# Patient Record
Sex: Male | Born: 1975
Health system: Southern US, Community
[De-identification: ages and names within clinical notes are randomized; demographics above are authoritative.]

## PROBLEM LIST (undated history)

## (undated) DIAGNOSIS — I10 Essential (primary) hypertension: Secondary | ICD-10-CM

## (undated) DIAGNOSIS — G4733 Obstructive sleep apnea (adult) (pediatric): Secondary | ICD-10-CM

## (undated) DIAGNOSIS — E119 Type 2 diabetes mellitus without complications: Principal | ICD-10-CM

## (undated) DIAGNOSIS — J4520 Mild intermittent asthma, uncomplicated: Secondary | ICD-10-CM

## (undated) DIAGNOSIS — K852 Alcohol induced acute pancreatitis without necrosis or infection: Secondary | ICD-10-CM

## (undated) DIAGNOSIS — E1110 Type 2 diabetes mellitus with ketoacidosis without coma: Secondary | ICD-10-CM

## (undated) DIAGNOSIS — E785 Hyperlipidemia, unspecified: Secondary | ICD-10-CM

## (undated) HISTORY — DX: Essential (primary) hypertension: I10

## (undated) HISTORY — PX: NO PAST SURGERIES: SHX2092

## (undated) HISTORY — DX: Obstructive sleep apnea (adult) (pediatric): G47.33

## (undated) HISTORY — DX: Type 2 diabetes mellitus without complications: E11.9

## (undated) HISTORY — DX: Hyperlipidemia, unspecified: E78.5

## (undated) HISTORY — DX: Mild intermittent asthma, uncomplicated: J45.20

## (undated) HISTORY — DX: Alcohol induced acute pancreatitis without necrosis or infection: K85.20

---

## 1999-08-18 DIAGNOSIS — K852 Alcohol induced acute pancreatitis without necrosis or infection: Secondary | ICD-10-CM

## 1999-08-18 HISTORY — DX: Alcohol induced acute pancreatitis without necrosis or infection: K85.20

## 2005-06-13 ENCOUNTER — Emergency Department (HOSPITAL_COMMUNITY): Admission: EM | Admit: 2005-06-13 | Discharge: 2005-06-14 | Payer: Self-pay | Admitting: Emergency Medicine

## 2005-06-14 ENCOUNTER — Emergency Department (HOSPITAL_COMMUNITY): Admission: EM | Admit: 2005-06-14 | Discharge: 2005-06-14 | Payer: Self-pay | Admitting: Emergency Medicine

## 2005-07-17 ENCOUNTER — Emergency Department (HOSPITAL_COMMUNITY): Admission: EM | Admit: 2005-07-17 | Discharge: 2005-07-17 | Payer: Self-pay | Admitting: Emergency Medicine

## 2005-08-04 ENCOUNTER — Emergency Department (HOSPITAL_COMMUNITY): Admission: EM | Admit: 2005-08-04 | Discharge: 2005-08-04 | Payer: Self-pay | Admitting: Emergency Medicine

## 2005-08-06 ENCOUNTER — Emergency Department (HOSPITAL_COMMUNITY): Admission: EM | Admit: 2005-08-06 | Discharge: 2005-08-06 | Payer: Self-pay | Admitting: Emergency Medicine

## 2005-10-08 ENCOUNTER — Ambulatory Visit: Payer: Self-pay | Admitting: Family Medicine

## 2005-10-08 ENCOUNTER — Inpatient Hospital Stay (HOSPITAL_COMMUNITY): Admission: EM | Admit: 2005-10-08 | Discharge: 2005-10-10 | Payer: Self-pay | Admitting: Emergency Medicine

## 2006-01-04 ENCOUNTER — Emergency Department (HOSPITAL_COMMUNITY): Admission: EM | Admit: 2006-01-04 | Discharge: 2006-01-04 | Payer: Self-pay | Admitting: Emergency Medicine

## 2007-05-12 ENCOUNTER — Emergency Department (HOSPITAL_COMMUNITY): Admission: EM | Admit: 2007-05-12 | Discharge: 2007-05-12 | Payer: Self-pay | Admitting: Emergency Medicine

## 2007-10-16 ENCOUNTER — Emergency Department (HOSPITAL_COMMUNITY): Admission: EM | Admit: 2007-10-16 | Discharge: 2007-10-16 | Payer: Self-pay | Admitting: Emergency Medicine

## 2007-11-23 ENCOUNTER — Ambulatory Visit: Payer: Self-pay | Admitting: Internal Medicine

## 2007-11-23 DIAGNOSIS — E785 Hyperlipidemia, unspecified: Secondary | ICD-10-CM

## 2007-11-23 DIAGNOSIS — I10 Essential (primary) hypertension: Secondary | ICD-10-CM

## 2007-11-23 DIAGNOSIS — J4520 Mild intermittent asthma, uncomplicated: Secondary | ICD-10-CM

## 2007-11-23 DIAGNOSIS — J309 Allergic rhinitis, unspecified: Secondary | ICD-10-CM | POA: Insufficient documentation

## 2007-11-23 HISTORY — DX: Hyperlipidemia, unspecified: E78.5

## 2007-11-23 HISTORY — DX: Essential (primary) hypertension: I10

## 2007-11-23 HISTORY — DX: Mild intermittent asthma, uncomplicated: J45.20

## 2007-11-29 ENCOUNTER — Encounter: Payer: Self-pay | Admitting: Internal Medicine

## 2007-11-29 ENCOUNTER — Ambulatory Visit (HOSPITAL_BASED_OUTPATIENT_CLINIC_OR_DEPARTMENT_OTHER): Admission: RE | Admit: 2007-11-29 | Discharge: 2007-11-29 | Payer: Self-pay | Admitting: Internal Medicine

## 2007-12-04 ENCOUNTER — Ambulatory Visit: Payer: Self-pay | Admitting: Internal Medicine

## 2008-01-03 ENCOUNTER — Ambulatory Visit: Payer: Self-pay | Admitting: Internal Medicine

## 2008-01-03 DIAGNOSIS — G4733 Obstructive sleep apnea (adult) (pediatric): Secondary | ICD-10-CM

## 2008-01-03 HISTORY — DX: Obstructive sleep apnea (adult) (pediatric): G47.33

## 2008-01-12 ENCOUNTER — Encounter: Payer: Self-pay | Admitting: Internal Medicine

## 2008-02-10 ENCOUNTER — Ambulatory Visit: Payer: Self-pay | Admitting: Internal Medicine

## 2008-02-15 ENCOUNTER — Encounter: Payer: Self-pay | Admitting: Internal Medicine

## 2008-06-20 ENCOUNTER — Encounter: Admission: RE | Admit: 2008-06-20 | Discharge: 2008-06-20 | Payer: Self-pay | Admitting: Family Medicine

## 2008-07-17 ENCOUNTER — Encounter: Payer: Self-pay | Admitting: Internal Medicine

## 2009-03-09 ENCOUNTER — Emergency Department: Payer: Self-pay | Admitting: Emergency Medicine

## 2009-10-17 ENCOUNTER — Emergency Department: Payer: Self-pay | Admitting: Emergency Medicine

## 2009-10-17 ENCOUNTER — Other Ambulatory Visit: Payer: Self-pay

## 2010-04-23 ENCOUNTER — Emergency Department: Payer: Self-pay | Admitting: Emergency Medicine

## 2010-11-05 ENCOUNTER — Emergency Department: Payer: Self-pay | Admitting: Emergency Medicine

## 2010-12-30 NOTE — Procedures (Signed)
Wayne Leonard, Wayne Leonard             ACCOUNT NO.:  [id number]   MEDICAL RECORD NO.:  [id number]          PATIENT TYPE:  OUT   LOCATION:  SLEEP CENTER                 FACILITY:  [facility]   PHYSICIAN:  Clinton D. Maple Hudson, MD, FCCP, FACPDATE OF BIRTH:  12-05-76   DATE OF STUDY:  11/29/2007                            NOCTURNAL POLYSOMNOGRAM   REFERRING PHYSICIAN:  Clinton D. Young, MD, FCCP, FACP   INDICATION FOR STUDY:  Hypersomnia with sleep apnea.   EPWORTH SLEEPINESS SCORE:  20/24, BMI 50, weight 348 pounds, height 5  feet 1 inch, neck 18.5 inches.   MEDICATIONS:  Home medication charted and reviewed.   SLEEP ARCHITECTURE:  Split study protocol.  During the diagnostic phase  total sleep time was 122 minutes with sleep efficiency 95.3%.  Stage I  was 6.1%, stage II 93.9%, stages III and IV were absent.  Sleep latency  5 minutes, awake after sleep onset 0.5 minutes, arousal index 44.3,  indicating increased sleep fragmentation.  No bedtime medication was  given.   RESPIRATORY DATA:  Split study protocol.  Apnea-hypopnea index (AHI)  55.1 per hour, indicating severe obstructive sleep apnea/hypopnea  syndrome.  There were 112 events, including 40 obstructive apneas and 72  hypopneas before CPAP.  Events were not positional.  CPAP was titrated  with optimal pressure range 18 CWP, AHI 0 per hour.  Higher pressures  were associated with some breakthrough events, suggesting nasal  congestion.  He chose a large Mirage Quattro mask with heated  humidifier.   OXYGEN DATA:  Moderate snoring with oxygen desaturation to a nadir of  79% before CPAP.  After CPAP control, mean oxygen saturation held 94.1%  on room air.   CARDIAC DATA:  Sinus rhythm.   MOVEMENT-PARASOMNIA:  No significant movement disturbance, no bathroom  trips.   IMPRESSIONS-RECOMMENDATIONS:  1. Severe obstructive sleep apnea/hypopnea syndrome, apnea-hypopnea      index 55.1 with nonpositional events, moderate snoring and  oxygen      desaturation to a nadir of 79%.  2. Successful continuous positive airway pressure titration to 18      centimeters of water pressure, apnea-hypopnea index 0 per hour.  He      chose a large Mirage Quattro mask with heated humidifier.      Clinton D. Maple Hudson, MD, [facility], FACP  Diplomate, Biomedical engineer of Sleep Medicine  Electronically Signed     CDY/MEDQ  D:  12/04/2007 08:33:20  T:  12/04/2007 08:54:51  Job:  [id number]

## 2011-01-02 NOTE — Discharge Summary (Signed)
Wayne Leonard, Wayne Leonard NO.:  [id number]   MEDICAL RECORD NO.:  [id number]          PATIENT TYPE:  INP   LOCATION:  5730                         FACILITY:  [facility]   PHYSICIAN:  Ursula Beath, MD  DATE OF BIRTH:  1976/07/28   DATE OF ADMISSION:  10/08/2005  DATE OF DISCHARGE:  10/10/2005                                 DISCHARGE SUMMARY   DISCHARGE DIAGNOSES:  1.  Viral gastroenteritis.  2.  Hypertension.   DISCHARGE INSTRUCTIONS:  No discharge instructions or medications were given  because the patient left AMA prior to being seen by the physician on the  morning of the 24th.   HOSPITAL COURSE:  [name] is a 35 year old gentleman with a past  medical history of acute pancreatitis and asthma, who the day of admission  had a sudden onset of nausea and vomiting.  He also had some diffuse  abdominal pain and myalgias.  He came to the emergency department because he  was unable to control his vomiting and became somewhat dehydrated.  He was  hydrated and given antiemetics during the course of his hospitalization.  He  was noted to have some elevated blood pressures during his hospitalization,  but it was felt that those were possibly secondary to his blood pressures  being taken just after episodes of retching, but there is certainly a  possibility that he is hypertensive and this has not been diagnosed or  treated as an outpatient.   LABORATORY DATA ON ADMISSION:  White count 8.9, H&H 15.3 and 45.8, platelets  197, MCV 78.  cMET:  Sodium 138, potassium 4.3, chloride 105, bicarb 27, BUN  8, creatinine 1.3, glucose 124.  Total protein 7.6, albumin 4.1, AST 30, ALT  31, alk-phos 64, total bilirubin 0.5, lipase was 52.  Please see the full  dictated History and Physical for details of the history and physical.   HOSPITAL COURSE:  The patient apparently felt a lot better after his night  in hospital as he decided to leave against medical advice.  A Leaving  Against Medical Advice form was obtained by the nurse, and I was called and  informed of his leaving the hospital without being seen this morning.      Ursula Beath, MD     JT/MEDQ  D:  10/10/2005  T:  10/11/2005  Job:  [id number]

## 2011-01-02 NOTE — H&P (Signed)
Wayne Leonard, Wayne Leonard             ACCOUNT NO.:  [id number]   MEDICAL RECORD NO.:  [id number]          PATIENT TYPE:  INP   LOCATION:  1830                         FACILITY:  [facility]   PHYSICIAN:  Pearlean Brownie, M.D.DATE OF BIRTH:  [birth year]   DATE OF ADMISSION:  10/08/2005  DATE OF DISCHARGE:                                HISTORY & PHYSICAL   PRIMARY CARE PHYSICIAN:  Unassigned.   CHIEF COMPLAINT:  Nausea and vomiting.   HISTORY OF PRESENT ILLNESS:  Patient is a 35 year old male with a past  medical history below with nausea and vomiting sudden onset today at  approximately 4:30 a.m.  The vomitus was green and then orange.  There was  no blood noted per the patient.  He had no black vomitus.  He had episodic  diffuse abdominal pain associated with myalgias and also diaphoresis and  chills.  He has no known sick contacts.  He had a bowel movement yesterday.  This was not loose.  He has had no bright red blood per rectum, no cough,  and no diarrhea.  He has had very little p.o. intake today.  He came to the  emergency room.  After continued symptoms he was given Phenergan, Zofran,  Reglan, and IV fluids.  He is now drowsy with little p.o. intake.   PAST MEDICAL HISTORY:  1.  Asthma without intubations.  2.  History of pancreatitis.   SOCIAL HISTORY:  Patient smokes about one pack per day.  He does not use any  alcohol.  He does not use any drugs.  He is married with one child.  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   FAMILY HISTORY:  Noted for a history of hypertension.  His parents are alive  and otherwise healthy.   MEDICATIONS:  Albuterol p.r.n.  He is not on any corticosteroid suppressant  therapy for his asthma.  He says he uses his __________ very infrequently.   ALLERGIES:  IODINE.   REVIEW OF SYSTEMS:  Otherwise noncontributory.   PHYSICAL EXAMINATION:  VITAL SIGNS:  Temperature 98.5, respiratory rate 20,  pulse 76, blood pressure 154/86.   Patient is 100% on room air.  GENERAL:  He is drowsy after the Phenergan.  I can wake him up but he will  fall asleep at various points during the examination.  He is overall in no  apparent distress.  HEENT:  Normocephalic, atraumatic.  Mucous membranes are mildly dry.  NECK:  Supple.  No lymphadenopathy.  CARDIOVASCULAR:  Normal S1.  Normal S2.  No murmurs, rubs, or gallops  appreciated.  RESPIRATORY:  Clear to auscultation bilaterally.  No wheezes, rales.  No  rhonchi appreciated.  He does have upper airway noise that is appreciated  when the patient is snoring while asleep.  This resolves when he wakes.  ABDOMEN:  Soft, nontender, nondistended.  Positive bowel sounds.  No  rebound.  No guarding.  Abdomen is obese.  EXTREMITIES:  No edema.  2+ pulses.  NEUROLOGIC:  Grossly intact but the patient uncooperative with the  examination secondary to being drowsy.  SKIN:  No rash.  RECTAL:  Fecal occult blood test is negative.  There is no stool noted in  vault.   LABORATORIES:  Hemoglobin 15.3, hematocrit 45.8, white count 8.9, platelets  197, MCV 78.  Electrolytes on CMP are as follows:  Sodium 138, potassium  4.3, chloride 105, bicarbonate 27, BUN 8, creatinine 1.3, glucose 124, total  protein 7.4, albumin 4.1, AST 30, ALT 31, alkaline phosphatase 64, total  bilirubin 0.5.  Chest x-ray and abdominal films for the acute abdominal  series show peribronchial thickening without active air space disease.  He  has no specific abdominal findings.  Fecal occult blood test is negative.   ASSESSMENT/PLAN:  Patient is a 35 year old male with the following problems.  1.  Vomiting.  This is presumed Norovirus in light of the recent community      outbreak.  He does have a viral picture given his myalgias, sweats,      chills, and vomiting.  He does not have an examination that is      consistent with a surgical abdomen.  He is also fecal occult blood      negative.  This is all reassuring.  I will  rehydrate him overnight and      follow his abdominal examination.  I will give him Phenergan and Zofran      for nausea p.r.n.  I will also follow his electrolytes.  I do think the      patient is very unlikely to have pancreatitis given his viral symptoms      and also his lipase.  Also think the patient is less likely to have      pancreatitis because he is pain-free at the moment and has been for a      significant amount of time while in the emergency room.  2.  Deep venous thrombosis prophylaxis.  Lovenox.  3.  Disposition.  Admit.  4.  Code status.  Full code.      [name], M.D.    ______________________________  Pearlean Brownie, M.D.    GSD/MEDQ  D:  10/08/2005  T:  10/08/2005  Job:  [id number]

## 2011-05-11 LAB — DIFFERENTIAL
Basophils Absolute: 0
Basophils Relative: 0
Eosinophils Absolute: 0.1
Eosinophils Relative: 1
Lymphocytes Relative: 13
Lymphs Abs: 1.3
Monocytes Absolute: 0.4
Monocytes Relative: 3
Neutro Abs: 8.8 — ABNORMAL HIGH
Neutrophils Relative %: 83 — ABNORMAL HIGH

## 2011-05-11 LAB — COMPREHENSIVE METABOLIC PANEL
ALT: 37
AST: 36
Albumin: 4.1
Alkaline Phosphatase: 52
BUN: 4 — ABNORMAL LOW
CO2: 29
Calcium: 9.2
Chloride: 103
Creatinine, Ser: 1.36
GFR calc Af Amer: >60
GFR calc non Af Amer: >60
Glucose, Bld: 128 — ABNORMAL HIGH
Potassium: 3.9
Sodium: 137
Total Bilirubin: 0.4
Total Protein: 7.5

## 2011-05-11 LAB — CBC
HCT: 44.9
Hemoglobin: 14.6
MCHC: 32.5
MCV: 79.4
Platelets: 215
RBC: 5.66
RDW: 16 — ABNORMAL HIGH
WBC: 10.6 — ABNORMAL HIGH

## 2011-05-11 LAB — TROPONIN I: Troponin I: 0.01

## 2011-05-11 LAB — LIPASE, BLOOD: Lipase: 48

## 2011-05-11 LAB — CK TOTAL AND CKMB (NOT AT ARMC)
CK, MB: 2.4
Relative Index: 0.3
Total CK: 805 — ABNORMAL HIGH

## 2011-10-02 IMAGING — CR DG CHEST 2V
1 series · 2 of 2 positions shown · non-contrast
Comparison: none

REASON FOR EXAM: SHOB
COMMENTS:   May transport without cardiac monitor

PROCEDURE:     DXR - DXR CHEST PA (OR AP) AND LATERAL  - November 05, 2010 [DATE]
RESULT:     Comparison is made to the prior exam of 10/17/2009. The lung
fields are clear. The heart, mediastinal and osseous structures are normal
in appearance.

[Series 1: view not recorded · 0.17mm/px · 2 of 2 slices shown]
[im 1/2]
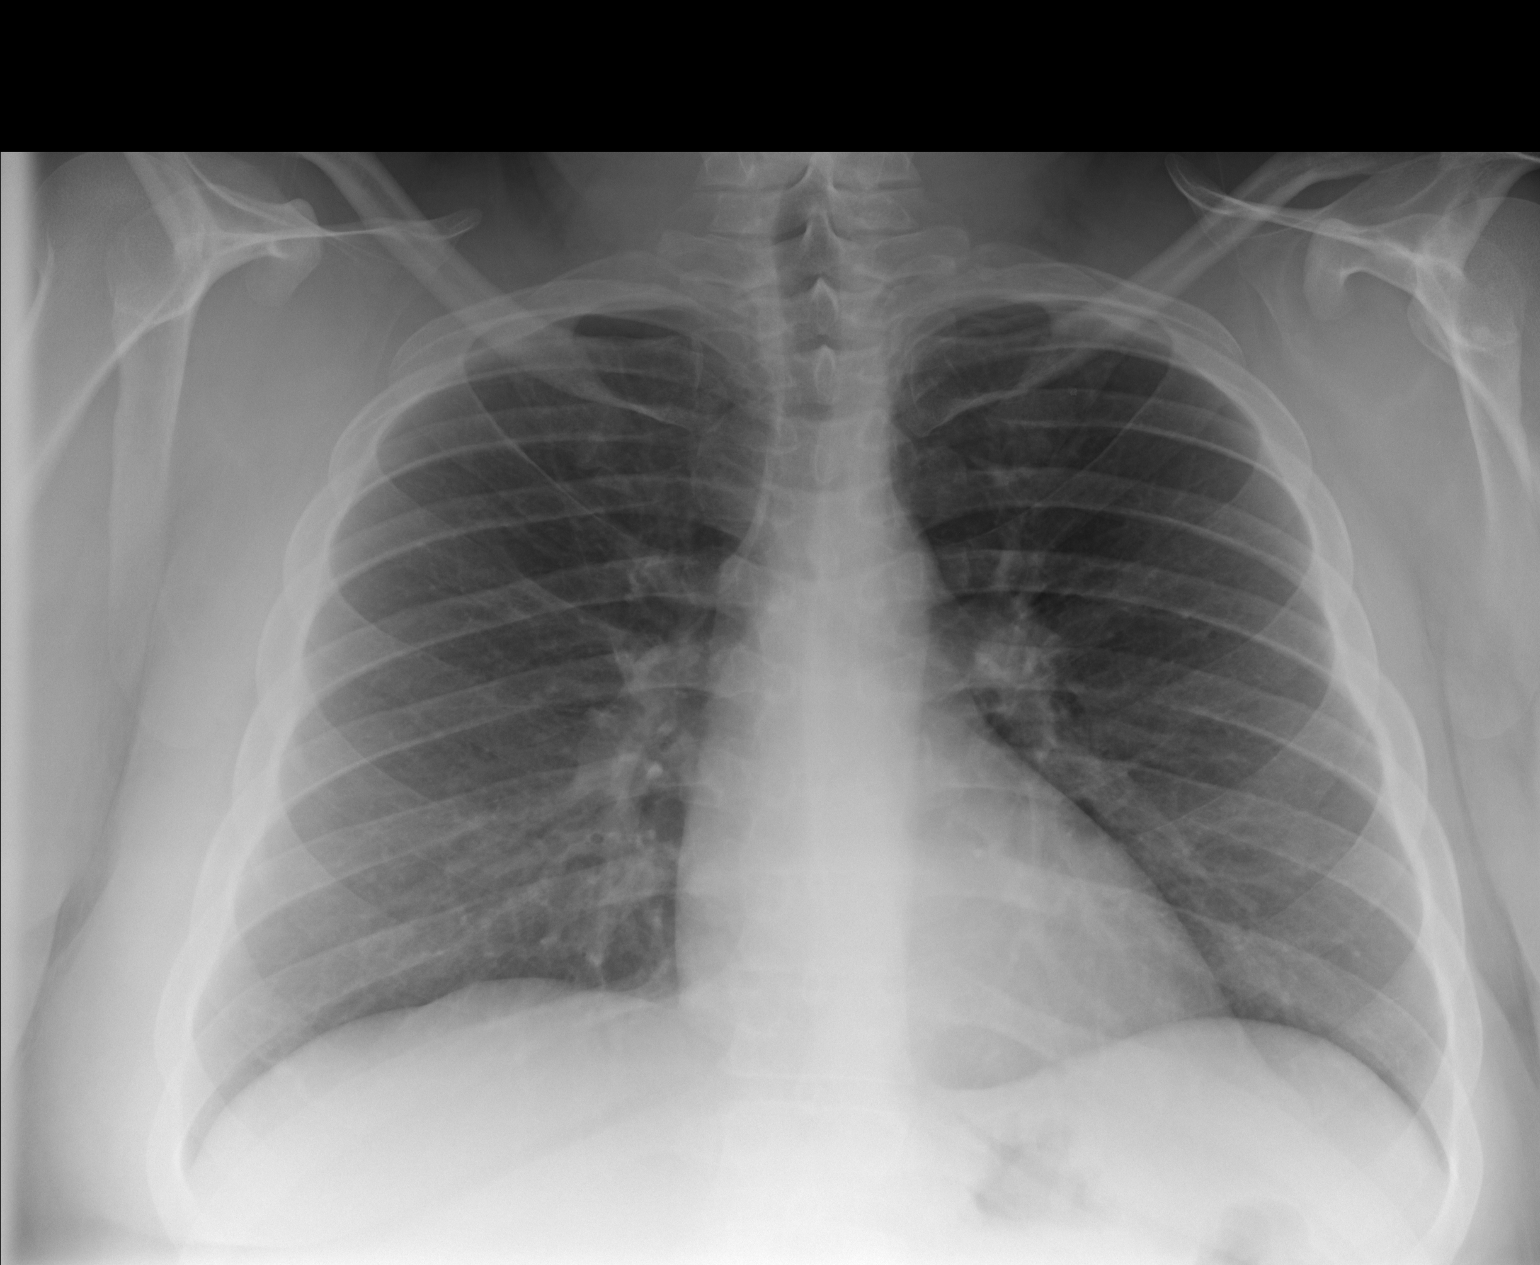
[im 2/2]
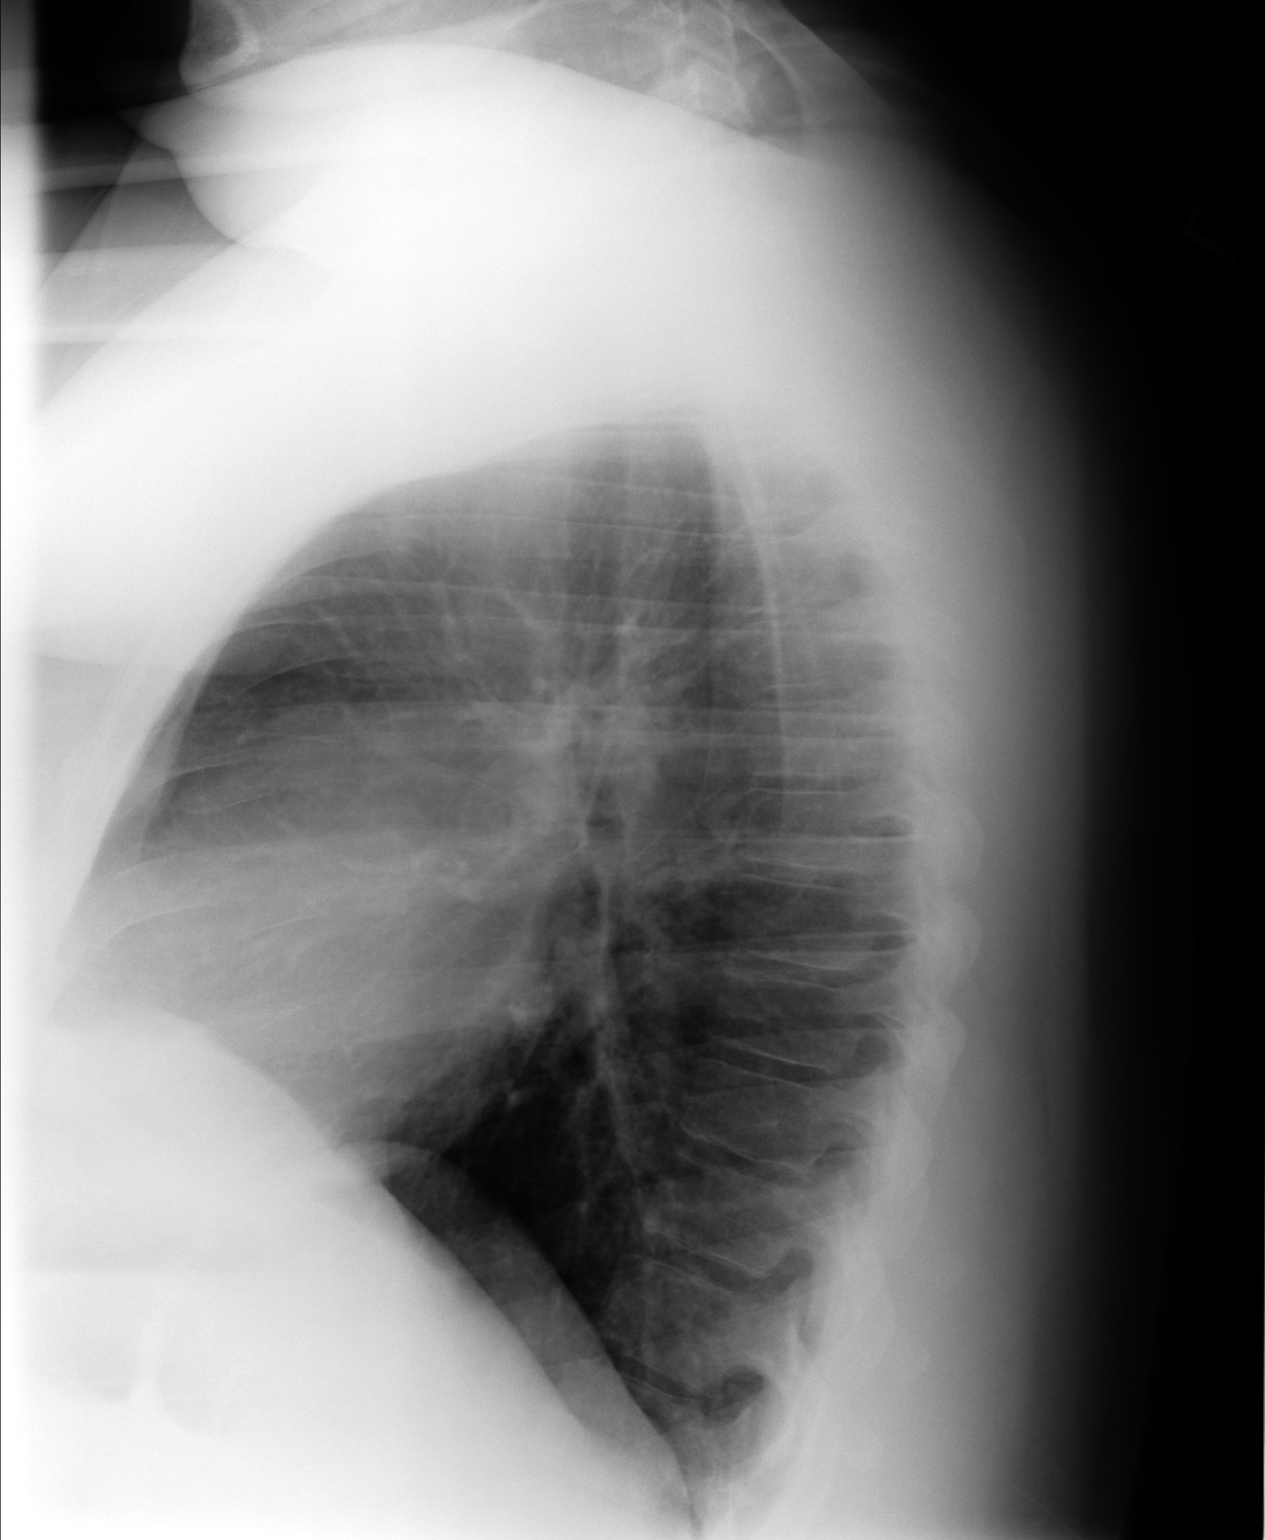

[2 of 2 positions shown; findings below may reference images not displayed]

IMPRESSION: No significant abnormalities are noted.

## 2015-09-24 ENCOUNTER — Encounter: Payer: Self-pay | Admitting: Internal Medicine

## 2015-09-24 ENCOUNTER — Ambulatory Visit (INDEPENDENT_AMBULATORY_CARE_PROVIDER_SITE_OTHER): Payer: Medicaid Other | Admitting: Internal Medicine

## 2015-09-24 VITALS — BP 152/95 | HR 72 | Temp 98.3°F | Resp 18 | Ht 70.0 in | Wt 295.2 lb

## 2015-09-24 DIAGNOSIS — E119 Type 2 diabetes mellitus without complications: Secondary | ICD-10-CM | POA: Diagnosis not present

## 2015-09-24 DIAGNOSIS — G4733 Obstructive sleep apnea (adult) (pediatric): Secondary | ICD-10-CM | POA: Diagnosis not present

## 2015-09-24 DIAGNOSIS — F17210 Nicotine dependence, cigarettes, uncomplicated: Secondary | ICD-10-CM

## 2015-09-24 DIAGNOSIS — I10 Essential (primary) hypertension: Secondary | ICD-10-CM

## 2015-09-24 DIAGNOSIS — Z7984 Long term (current) use of oral hypoglycemic drugs: Secondary | ICD-10-CM

## 2015-09-24 DIAGNOSIS — E785 Hyperlipidemia, unspecified: Secondary | ICD-10-CM | POA: Diagnosis not present

## 2015-09-24 DIAGNOSIS — J4520 Mild intermittent asthma, uncomplicated: Secondary | ICD-10-CM | POA: Diagnosis not present

## 2015-09-24 DIAGNOSIS — E1110 Type 2 diabetes mellitus with ketoacidosis without coma: Secondary | ICD-10-CM | POA: Insufficient documentation

## 2015-09-24 DIAGNOSIS — F17200 Nicotine dependence, unspecified, uncomplicated: Secondary | ICD-10-CM | POA: Insufficient documentation

## 2015-09-24 HISTORY — DX: Type 2 diabetes mellitus without complications: E11.9

## 2015-09-24 LAB — POCT GLYCOSYLATED HEMOGLOBIN (HGB A1C): Hemoglobin A1C: 5.8

## 2015-09-24 LAB — GLUCOSE, CAPILLARY: Glucose-Capillary: 104 mg/dL — ABNORMAL HIGH (ref 65–99)

## 2015-09-24 MED ORDER — VARENICLINE TARTRATE 1 MG PO TABS: 1.0000 mg | ORAL_TABLET | Freq: Two times a day (BID) | ORAL | Status: DC

## 2015-09-24 MED ORDER — ALBUTEROL SULFATE HFA 108 (90 BASE) MCG/ACT IN AERS: 2.0000 | INHALATION_SPRAY | Freq: Four times a day (QID) | RESPIRATORY_TRACT | Status: DC | PRN

## 2015-09-24 MED ORDER — AMLODIPINE BESYLATE 10 MG PO TABS: 10.0000 mg | ORAL_TABLET | Freq: Every day | ORAL | Status: DC

## 2015-09-24 MED ORDER — LISINOPRIL 20 MG PO TABS: 20.0000 mg | ORAL_TABLET | Freq: Every day | ORAL | Status: DC

## 2015-09-24 MED ORDER — METFORMIN HCL 500 MG PO TABS: 500.0000 mg | ORAL_TABLET | Freq: Two times a day (BID) | ORAL | Status: DC

## 2015-09-24 MED ORDER — VARENICLINE TARTRATE 0.5 MG X 11 & 1 MG X 42 PO MISC: ORAL | Status: DC

## 2015-09-24 NOTE — Patient Instructions (Signed)
General Instructions:   Please bring your medicines with you each time you come to clinic.  Medicines may include prescription medications, over-the-counter medications, herbal remedies, eye drops, vitamins, or other pills.   Progress Toward Treatment Goals:  Treatment Goal 09/24/2015  Hemoglobin A1C at goal  Blood pressure unable to assess  Stop smoking smoking the same amount    Self Care Goals & Plans:  Self Care Goal 09/24/2015  Manage my medications take my medicines as prescribed  Eat healthy foods eat more vegetables; eat foods that are low in salt; eat baked foods instead of fried foods  Be physically active find an activity I enjoy  Stop smoking go to the [employer] ([email or web address]); set a quit date and stop smoking    Home Blood Glucose Monitoring 09/24/2015  Check my blood sugar no home glucose monitoring     Care Management & Community Referrals:  Referral 09/24/2015  Referrals made for care management support none needed

## 2015-09-24 NOTE — Assessment & Plan Note (Addendum)
HPI: He reports his DM is well controlled with Metformin, he alternates 1g QOD with  QOD.  His heaviest was 320lbs but reports he is down from that point but his weight is trending up again,  He would like to eventually be off  Medication altogether.  A: Type 2 DM without complication, well controlled  P: Set A1c goal of 6.5 Discussed weight loss Continue metformin, may continue his current regimen of alternating 1g and  if he wishes No home glucose monitoring Retinal scan ordered today Urine micro albumin ordered (patient is on ACEi) Given his Family history of early cardiac death of his father may consider ASA for primary prevention in the future will obtain lipid and adjust statin first.

## 2015-09-24 NOTE — Assessment & Plan Note (Signed)
HPI: Reports his blood pressure has been borderline high at his last visit.  He takes amlodipine and Lisinopril.  He has no problems with his medications.   A: Essential HTN  P: -Continue Amlodipine  daily, Lisinopril  daily, I discussed adding HCTZ but wants to do diet/ exercise and stop smoking first.

## 2015-09-24 NOTE — Assessment & Plan Note (Signed)
HPI: on pravastatin  daily  A: HLD  P: Repeat lipid panel today, I suspect his risk with uncontrolled HTN, DM and smoking is well above 20% and he needs high intensity statin, will adjust accordingly.

## 2015-09-24 NOTE — Assessment & Plan Note (Signed)
HPI: Hx of asthma, mostly during seasons change.  Uses Albuterol PRN.  Has symptoms <2x per month.  No night awakenings.  A: Intermittent asthma well controlled  P:  Albuterol PRN

## 2015-09-24 NOTE — Progress Notes (Signed)
[facility] Subjective:   Patient ID: Wayne Leonard male   DOB: 07/19/1976 40 y.o.   MRN: [id number]  HPI: [name] is a 40 y.o. male with a PMH detailed below who presents to establish care.  He was previously a patient of Dr Bruna Potter.  Please see problem based charting below for the status of his chronic medical problems.    Past Medical History  Diagnosis Date  . Essential hypertension 11/23/2007  . Intermittent asthma, well controlled 11/23/2007  . Type 2 diabetes mellitus without complication, without long-term current use of insulin (HCC) 09/24/2015  . Hyperlipidemia 11/23/2007  . Obstructive sleep apnea 01/03/2008  . Pancreatitis, alcoholic, acute 2001   Current Outpatient Prescriptions  Medication Sig Dispense Refill  . albuterol (PROVENTIL HFA;VENTOLIN HFA) 108 (90 Base) MCG/ACT inhaler Inhale 2 puffs into the lungs every 6 (six) hours as needed for wheezing or shortness of breath. 1 Inhaler 2  . amLODipine (NORVASC) 10 MG tablet Take 1 tablet (10 mg total) by mouth daily. 90 tablet 0  . lisinopril (PRINIVIL,ZESTRIL) 20 MG tablet Take 1 tablet (20 mg total) by mouth daily. 90 tablet 0  . metFORMIN (GLUCOPHAGE) 500 MG tablet Take 1 tablet (500 mg total) by mouth 2 (two) times daily with a meal. 180 tablet 0  . varenicline (CHANTIX CONTINUING MONTH PAK) 1 MG tablet Take 1 tablet (1 mg total) by mouth 2 (two) times daily. 60 tablet 1  . varenicline (CHANTIX STARTING MONTH PAK) 0.5 MG X 11 & 1 MG X 42 tablet Take one 0.5 mg tab daily for 3 days, then 0.5 mg tab twice daily for 4 days, then 1 mg tab twice daily. 53 tablet 0   No current facility-administered medications for this visit.   Family History  Problem Relation Age of Onset  . Diabetes Mellitus II Mother   . Hypertension Mother   . Asthma Father   . Asthma Brother   . CAD Father    Social History   Social History  . Marital Status: Married    Spouse Name: N/A  . Number of Children:  N/A  . Years of Education: N/A   Social History Main Topics  . Smoking status: Current Every Day Smoker -- 1.00 packs/day for 20 years    Types: Cigarettes  . Smokeless tobacco: Never Used  . Alcohol Use: No  . Drug Use: No  . Sexual Activity:    Partners: Female   Other Topics Concern  . None   Social History Narrative   Review of Systems: Review of Systems  Constitutional: Negative for fever, chills and weight loss.  Eyes: Negative for blurred vision.  Respiratory: Negative for cough.   Cardiovascular: Negative for chest pain.  Gastrointestinal: Negative for abdominal pain.  Genitourinary: Negative for frequency.  Musculoskeletal: Negative for myalgias.  Endo/Heme/Allergies: Negative for polydipsia.  All other systems reviewed and are negative.    Objective:  Physical Exam: Filed Vitals:   09/24/15 0929  BP: 152/95  Pulse: 72  Temp: 98.3 F (36.8 C)  TempSrc: Oral  Resp: 18  Height:  (1.778 m)  Weight: 295 lb 3.2 oz (133.902 kg)  SpO2: 96%   Physical Exam  Constitutional: He is oriented to person, place, and time and well-developed, well-nourished, and in no distress.  HENT:  Head: Normocephalic and atraumatic.  Mouth/Throat: Oropharynx is clear and moist.  Eyes: Conjunctivae are normal.  Cardiovascular: Normal rate and regular rhythm.   Pulmonary/Chest: Effort normal and breath  sounds normal. He has no wheezes.  Abdominal: Soft. Bowel sounds are normal.  Musculoskeletal: He exhibits no edema.  Neurological: He is alert and oriented to person, place, and time.  Skin: Skin is warm and dry.  Psychiatric: Affect normal.  Nursing note and vitals reviewed.    Assessment & Plan:  Case discussed with Dr. Criselda Peaches  Essential hypertension HPI: Reports his blood pressure has been borderline high at his last visit.  He takes amlodipine and Lisinopril.  He has no problems with his medications.   A: Essential HTN  P: -Continue Amlodipine 10mg  daily,  Lisinopril 20mg  daily, I discussed adding HCTZ but wants to do diet/ exercise and stop smoking first.  Tobacco use disorder HPI: He is a 1ppd smoker for the last 20 years. He has attempted to quit before but becomes frustrated.  His wife no longer smokes and he is the only person he knows who still smokes, he would like to quit.  A: Tobacco Use disorder  P: -Rx Chantix for 12 weeks, discussed this medication and its potential side effects. - I spent a dedicated 5 minutes to smoking cessation and its benefits to his HTN, and cardiovascular risks.  Hyperlipidemia HPI: on pravastatin 20mg  daily  A: HLD  P: Repeat lipid panel today, I suspect his risk with uncontrolled HTN, DM and smoking is well above 20% and he needs high intensity statin, will adjust accordingly.  Type 2 diabetes mellitus without complication, without long-term current use of insulin (HCC) HPI: He reports his DM is well controlled with Metformin, he alternates 1g QOD with 500mg  QOD.  His heaviest was 320lbs but reports he is down from that point but his weight is trending up again,  He would like to eventually be off  Medication altogether.  A: Type 2 DM without complication, well controlled  P: Set A1c goal of 6.5 Discussed weight loss Continue metformin, may continue his current regimen of alternating 1g and 500mg  if he wishes No home glucose monitoring Retinal scan ordered today Urine micro albumin ordered (patient is on ACEi) Given his Family history of early cardiac death of his father may consider ASA for primary prevention in the future will obtain lipid and adjust statin first.  Intermittent asthma, well controlled HPI: Hx of asthma, mostly during seasons change.  Uses Albuterol PRN.  Has symptoms <2x per month.  No night awakenings.  A: Intermittent asthma well controlled  P:  Albuterol PRN  Obstructive sleep apnea HPI: Previously diagnosed, has since lost weight, does not think he snores as much.  Could not tolerate CPAP.  A: OSA  P:  Discussed considering repeating sleep study and discussed benefits of CPAP on weight and blood pressure however at this time patient is unwilling to try CPAP so will hold off on split night study. Encouraged further weight loss.    Medications Ordered Meds ordered this encounter  Medications  . varenicline (CHANTIX STARTING MONTH PAK) 0.5 MG X 11 & 1 MG X 42 tablet    Sig: Take one 0.5 mg tab daily for 3 days, then 0.5 mg tab twice daily for 4 days, then 1 mg tab twice daily.    Dispense:  53 tablet    Refill:  0  . varenicline (CHANTIX CONTINUING MONTH PAK) 1 MG tablet    Sig: Take 1 tablet (1 mg total) by mouth 2 (two) times daily.    Dispense:  60 tablet    Refill:  1  . lisinopril (PRINIVIL,ZESTRIL) 20 MG  tablet    Sig: Take 1 tablet (20 mg total) by mouth daily.    Dispense:  90 tablet    Refill:  0  . amLODipine (NORVASC) 10 MG tablet    Sig: Take 1 tablet (10 mg total) by mouth daily.    Dispense:  90 tablet    Refill:  0  . metFORMIN (GLUCOPHAGE) 500 MG tablet    Sig: Take 1 tablet (500 mg total) by mouth 2 (two) times daily with a meal.    Dispense:  180 tablet    Refill:  0  . albuterol (PROVENTIL HFA;VENTOLIN HFA) 108 (90 Base) MCG/ACT inhaler    Sig: Inhale 2 puffs into the lungs every 6 (six) hours as needed for wheezing or shortness of breath.    Dispense:  1 Inhaler    Refill:  2   Other Orders Orders Placed This Encounter  Procedures  . Glucose, capillary  . BMP8+Anion Gap  . Microalbumin / Creatinine Urine Ratio  . CBC no Diff  . Lipid Profile  . POC Hbg A1C  . Retinal/fundus photography    Standing Status: Future     Number of Occurrences:      Standing Expiration Date: 09/23/2016   Follow Up: Return in about 3 months (around 12/22/2015).

## 2015-09-24 NOTE — Assessment & Plan Note (Signed)
HPI: He is a 1ppd smoker for the last 20 years. He has attempted to quit before but becomes frustrated.  His wife no longer smokes and he is the only person he knows who still smokes, he would like to quit.  A: Tobacco Use disorder  P: -Rx Chantix for 12 weeks, discussed this medication and its potential side effects. - I spent a dedicated 5 minutes to smoking cessation and its benefits to his HTN, and cardiovascular risks.

## 2015-09-24 NOTE — Assessment & Plan Note (Signed)
HPI: Previously diagnosed, has since lost weight, does not think he snores as much. Could not tolerate CPAP.  A: OSA  P:  Discussed considering repeating sleep study and discussed benefits of CPAP on weight and blood pressure however at this time patient is unwilling to try CPAP so will hold off on split night study. Encouraged further weight loss.

## 2015-09-25 LAB — CBC
Hematocrit: 45.8 % (ref 37.5–51.0)
Hemoglobin: 14.8 g/dL (ref 12.6–17.7)
MCH: 25 pg — ABNORMAL LOW (ref 26.6–33.0)
MCHC: 32.3 g/dL (ref 31.5–35.7)
MCV: 78 fL — ABNORMAL LOW (ref 79–97)
Platelets: 283 10*3/uL (ref 150–379)
RBC: 5.91 x10E6/uL — ABNORMAL HIGH (ref 4.14–5.80)
RDW: 17.8 % — ABNORMAL HIGH (ref 12.3–15.4)
WBC: 9.4 10*3/uL (ref 3.4–10.8)

## 2015-09-25 LAB — BMP8+ANION GAP
Anion Gap: 20 mmol/L — ABNORMAL HIGH (ref 10.0–18.0)
BUN/Creatinine Ratio: 10 (ref 8–19)
BUN: 11 mg/dL (ref 6–20)
CO2: 22 mmol/L (ref 18–29)
Calcium: 9.7 mg/dL (ref 8.7–10.2)
Chloride: 100 mmol/L (ref 96–106)
Creatinine, Ser: 1.1 mg/dL (ref 0.76–1.27)
GFR calc Af Amer: 98 mL/min/{1.73_m2} (ref >59)
GFR calc non Af Amer: 85 mL/min/{1.73_m2} (ref >59)
Glucose: 95 mg/dL (ref 65–99)
Potassium: 4.4 mmol/L (ref 3.5–5.2)
Sodium: 142 mmol/L (ref 134–144)

## 2015-09-25 LAB — LIPID PANEL
Chol/HDL Ratio: 4.6 ratio units (ref 0.0–5.0)
Cholesterol, Total: 188 mg/dL (ref 100–199)
HDL: 41 mg/dL (ref >39)
LDL Calculated: 113 mg/dL — ABNORMAL HIGH (ref 0–99)
Triglycerides: 170 mg/dL — ABNORMAL HIGH (ref 0–149)
VLDL Cholesterol Cal: 34 mg/dL (ref 5–40)

## 2015-09-25 LAB — MICROALBUMIN / CREATININE URINE RATIO
Creatinine, Urine: 143.7 mg/dL
MICROALB/CREAT RATIO: <2.1 mg/g creat (ref 0.0–30.0)
Microalbumin, Urine: <3 ug/mL

## 2015-09-26 ENCOUNTER — Encounter: Payer: Self-pay | Admitting: Internal Medicine

## 2015-09-30 NOTE — Progress Notes (Signed)
Internal Medicine Clinic Attending  Case discussed with Dr. Hoffman at the time of the visit.  We reviewed the resident's history and exam and pertinent patient test results.  I agree with the assessment, diagnosis, and plan of care documented in the resident's note.  

## 2015-10-25 ENCOUNTER — Other Ambulatory Visit: Payer: Self-pay | Admitting: *Deleted

## 2015-10-25 NOTE — Telephone Encounter (Signed)
Has f/u appt w/Dr Loney Lohathore 5/9.

## 2015-10-25 NOTE — Telephone Encounter (Signed)
Pt has rx for continuing month pak also written 2/7; pharmacy made awared.

## 2015-10-25 NOTE — Telephone Encounter (Signed)
Patient does not need the Chantix starter pack refilled, this is intended to be just for the first month. Then she should use the full dose Chantix Rx that Dr. Mikey BussingHoffman wrote on 2/7. This will be one tab bid for two months, then stop.

## 2015-11-25 ENCOUNTER — Other Ambulatory Visit: Payer: Self-pay | Admitting: *Deleted

## 2015-11-27 ENCOUNTER — Other Ambulatory Visit: Payer: Self-pay

## 2015-11-27 NOTE — Telephone Encounter (Signed)
Opened in error

## 2015-12-23 ENCOUNTER — Telehealth: Payer: Self-pay | Admitting: Internal Medicine

## 2015-12-24 ENCOUNTER — Telehealth: Payer: Self-pay | Admitting: Dietician

## 2015-12-24 ENCOUNTER — Ambulatory Visit (INDEPENDENT_AMBULATORY_CARE_PROVIDER_SITE_OTHER): Payer: Medicaid Other | Admitting: Internal Medicine

## 2015-12-24 VITALS — BP 130/75 | HR 83 | Temp 98.1°F | Ht 70.0 in | Wt 302.3 lb

## 2015-12-24 DIAGNOSIS — J45901 Unspecified asthma with (acute) exacerbation: Secondary | ICD-10-CM

## 2015-12-24 DIAGNOSIS — F17210 Nicotine dependence, cigarettes, uncomplicated: Secondary | ICD-10-CM

## 2015-12-24 DIAGNOSIS — I10 Essential (primary) hypertension: Secondary | ICD-10-CM | POA: Diagnosis not present

## 2015-12-24 DIAGNOSIS — E785 Hyperlipidemia, unspecified: Secondary | ICD-10-CM

## 2015-12-24 DIAGNOSIS — Z7984 Long term (current) use of oral hypoglycemic drugs: Secondary | ICD-10-CM

## 2015-12-24 DIAGNOSIS — E119 Type 2 diabetes mellitus without complications: Secondary | ICD-10-CM | POA: Diagnosis not present

## 2015-12-24 DIAGNOSIS — J4521 Mild intermittent asthma with (acute) exacerbation: Secondary | ICD-10-CM

## 2015-12-24 DIAGNOSIS — F17200 Nicotine dependence, unspecified, uncomplicated: Secondary | ICD-10-CM

## 2015-12-24 MED ORDER — ALBUTEROL SULFATE (2.5 MG/3ML) 0.083% IN NEBU: 2.5000 mg | INHALATION_SOLUTION | Freq: Four times a day (QID) | RESPIRATORY_TRACT | Status: DC | PRN

## 2015-12-24 MED ORDER — NICOTINE 21 MG/24HR TD PT24: 21.0000 mg | MEDICATED_PATCH | TRANSDERMAL | Status: DC

## 2015-12-24 MED ORDER — ATORVASTATIN CALCIUM 40 MG PO TABS: 40.0000 mg | ORAL_TABLET | Freq: Every day | ORAL | Status: DC

## 2015-12-24 MED ORDER — NICOTINE POLACRILEX 2 MG MT GUM: CHEWING_GUM | OROMUCOSAL | Status: DC

## 2015-12-24 MED ORDER — METFORMIN HCL 500 MG PO TABS: 500.0000 mg | ORAL_TABLET | Freq: Every day | ORAL | Status: DC

## 2015-12-24 MED ORDER — AMLODIPINE BESYLATE 10 MG PO TABS: 10.0000 mg | ORAL_TABLET | Freq: Every day | ORAL | Status: DC

## 2015-12-24 MED ORDER — LISINOPRIL 20 MG PO TABS: 20.0000 mg | ORAL_TABLET | Freq: Every day | ORAL | Status: DC

## 2015-12-24 MED ORDER — PREDNISONE 10 MG PO TABS
ORAL_TABLET | ORAL | Status: DC
Start: 2015-12-24 — End: 2016-03-24

## 2015-12-24 MED ORDER — ALBUTEROL SULFATE (2.5 MG/3ML) 0.083% IN NEBU
2.5000 mg | INHALATION_SOLUTION | Freq: Once | RESPIRATORY_TRACT | Status: AC
  Administered 2015-12-24: 2.5 mg via RESPIRATORY_TRACT

## 2015-12-24 MED ORDER — ALBUTEROL SULFATE HFA 108 (90 BASE) MCG/ACT IN AERS: 2.0000 | INHALATION_SPRAY | Freq: Four times a day (QID) | RESPIRATORY_TRACT | Status: DC | PRN

## 2015-12-24 NOTE — Patient Instructions (Addendum)
Please return for a follow up visit in 6 weeks.   Start using Nicoderm patches and Nicotine gum as instructed.  Please make sure you go for your eye exam.   Start taking Lipitor for your cholesterol as instructed.   Use Albuterol nebulizer as instructed.  Take Prednisone:  40 mg on day 1 30 mg on day 2 20 mg on day 3 10 mg on day 4

## 2015-12-24 NOTE — Telephone Encounter (Signed)
Steroid-Induced Hyperglycemia Prevention and Management Jolene SchimkeSenaca Krabill is a 40 y.o. male who [facility] (diabetes patient prescribed short course of steroids).  A/P Current Regimen Patient prescribed prednisone:40 mg on day 1, 30 mg on day 2, 20 mg on day 3, 10 mg on day 4 , currently on day 0 of therapy. Patient is not taking prednisone yet, but will take it in the AM when he gets it. Educated him that our goal is to keep his blood sugars < 180  Current DM regimen metformin 500 mg daily in the am  DM regimen prior to steroid course same as above  Home BG Monitoring  Patient does check BG at home and does have a meter at home. Meter was not supplied.  CBGs at home 113  CBGs prior to steroid course 100s , A1C prior to steroid course 5.8  S/Sx of hyper- or hypoglycemia: none, none  Medication Management  Switch prednisone dose to AM not applicable  Additional treatment for BG control is not indicated at this time.   Patient Education  Advised patient to monitor BG while on steroid therapy (at least twice daily prior to first 2 meals of the day).  Patient educated about signs/symptoms and advised to contact clinic if hyper- or hypoglycemic or if BG remain elevated >130.  Patient did  verbalize understanding of information and regimen by repeating back topics discussed.  Follow-up call late Wednesday or Thursday   June 20th, 2017- may need to reschedule if CBgs rise  Morning Halberg, Lupita LeashDonna 4:36 PM 12/24/2015

## 2015-12-26 DIAGNOSIS — J45901 Unspecified asthma with (acute) exacerbation: Secondary | ICD-10-CM | POA: Insufficient documentation

## 2015-12-26 NOTE — Assessment & Plan Note (Signed)
BP Readings from Last 3 Encounters:  12/24/15 130/75  09/24/15 152/95  02/10/08 150/88    Lab Results  Component Value Date   NA 142 09/24/2015   K 4.4 09/24/2015   CREATININE 1.10 09/24/2015    Assessment: Blood pressure control:  well-controlled Progress toward BP goal:   at goal (less than 140/90) Comments: He is currently taking amlodipine 10 mg daily and lisinopril 20 mg daily.  Plan: Medications:  continue current medications Educational resources provided:  encouraged healthy eating and exercise

## 2015-12-26 NOTE — Assessment & Plan Note (Signed)
Lab Results  Component Value Date   HGBA1C 5.8 09/24/2015     Assessment: Diabetes control:  well-controlled Progress toward A1C goal:   at goal (A1c less than 6.5) Comments: Patient is currently taking metformin 500 mg daily at home.   Plan: Medications:  continue current medications Instruction/counseling given: reminded to get eye exam, discussed foot care, discussed the need for weight loss and discussed diet Other plans:   -Check A1c in 3 months  -Foot exam today -Referral to ophthalmology for eye exam

## 2015-12-26 NOTE — Assessment & Plan Note (Signed)
Assessment: He is currently on pravastatin 20 mg daily. Lipid panel from February 2017 showing cholesterol 188, triglycerides 170, HDL 41, and LDL 113. Patient's 10 year ASCVD risk score is 69%. I discussed lifestyle modifications such as weight loss, controlling risk factors such as hypertension & diabetes, and the need to switch him to a high intensity statin. In addition, I spent a dedicated 5 minutes discussing the importance of smoking cessation.  Plan: -Discontinue pravastatin -Start high intensity statin therapy (Lipitor 40 mg daily) -Nicotine patches and gum for smoking cessation -Strict blood pressure and glycemic control -Weight loss

## 2015-12-26 NOTE — Assessment & Plan Note (Addendum)
Assessment: Patient's asthma was previously well-controlled but now he is presenting with an acute exacerbation (SOB and wheezing) likely secondary to recent viral URI (cough productive of sputum, congestion, rhinorrhea, and sneezing). Patient was afebrile and satting 97% on room air. Noted to be wheezing on lung exam. He had bilateral cervical lymphadenopathy likely due to viral URI. Oropharynx was noted to be erythematous but with no exudates, likely secondary to coughing.   Plan: -Refill albuterol inhaler -Prescribed albuterol nebulizer solution and machine -Prednisone taper: 40 mg 1 day, then 30 mg 1 day, then 20 mg 1 day, then 10 mg 1 day -Reassess symptoms at follow-up visit

## 2015-12-26 NOTE — Progress Notes (Signed)
Patient ID: Wayne SchimkeSenaca Leonard, male   DOB: 12/18/1975, 40 y.o.   MRN: [id number]   Subjective:   Patient ID: Wayne Leonard male   DOB: 01/24/1976 40 y.o.   MRN: [id number]  HPI: Wayne Leonard is a 40 y.o. male with a past medical history of conditions listed below presenting to the clinic with a complaint of cough productive of sputum, congestion, rhinorrhea, and sneezing for the past 3 days. States the phlegm is mostly white in color but sometimes brown or green. His nasal secretions are clear. Denies having any sore throat, sinus pain/pressure, fevers, chills, nausea, vomiting, or diarrhea. No recent sick contacts. States his asthma was previously well-controlled and his rescue inhaler lasted him the entire year. Reports being short of breath and wheezing for the past 3 days.     Past Medical History  Diagnosis Date  . Essential hypertension 11/23/2007  . Intermittent asthma, well controlled 11/23/2007  . Type 2 diabetes mellitus without complication, without long-term current use of insulin (HCC) 09/24/2015  . Hyperlipidemia 11/23/2007  . Obstructive sleep apnea 01/03/2008  . Pancreatitis, alcoholic, acute 2001   Current Outpatient Prescriptions  Medication Sig Dispense Refill  . albuterol (PROVENTIL HFA;VENTOLIN HFA) 108 (90 Base) MCG/ACT inhaler Inhale 2 puffs into the lungs every 6 (six) hours as needed for wheezing or shortness of breath. 1 Inhaler 6  . albuterol (PROVENTIL) (2.5 MG/3ML) 0.083% nebulizer solution Take 3 mLs (2.5 mg total) by nebulization every 6 (six) hours as needed for wheezing or shortness of breath. 75 mL 3  . amLODipine (NORVASC) 10 MG tablet Take 1 tablet (10 mg total) by mouth daily. 90 tablet 3  . atorvastatin (LIPITOR) 40 MG tablet Take 1 tablet (40 mg total) by mouth daily. 90 tablet 3  . lisinopril (PRINIVIL,ZESTRIL) 20 MG tablet Take 1 tablet (20 mg total) by mouth daily. 90 tablet 3  . metFORMIN (GLUCOPHAGE) 500 MG tablet Take 1 tablet (500 mg total) by mouth  daily with breakfast. 90 tablet 3  . nicotine (NICODERM CQ - DOSED IN MG/24 HOURS) 21 mg/24hr patch Place 1 patch (21 mg total) onto the skin daily. 42 patch 0  . nicotine polacrilex (RA NICOTINE) 2 MG gum Chew 1 gum every 2 hours as needed (MAXIMUM 24 pieces/ day) 100 tablet 2  . predniSONE (DELTASONE) 10 MG tablet Take 4 tablets (40 mg) on day 1, then 3 tablets (30 mg) on day 2, then 2 tablets (20 mg) on day 3, then 1 tablet (10 mg) on day 4. 10 tablet 0   No current facility-administered medications for this visit.   Family History  Problem Relation Age of Onset  . Diabetes Mellitus II Mother   . Hypertension Mother   . Asthma Father   . Asthma Brother   . CAD Father    Social History   Social History  . Marital Status: Married    Spouse Name: N/A  . Number of Children: N/A  . Years of Education: N/A   Social History Main Topics  . Smoking status: Current Every Day Smoker -- 1.00 packs/day for 20 years    Types: Cigarettes  . Smokeless tobacco: Never Used  . Alcohol Use: No  . Drug Use: No  . Sexual Activity:    Partners: Female   Other Topics Concern  . Not on file   Social History Narrative   Review of Systems: Review of Systems  Constitutional: Negative for fever, chills and malaise/fatigue.  HENT: Positive for congestion. Negative for  sore throat.        Rhinorrhea Sneezing  Eyes: Negative for blurred vision and pain.  Respiratory: Positive for cough, sputum production, shortness of breath and wheezing.   Cardiovascular: Negative for chest pain, palpitations and leg swelling.  Gastrointestinal: Negative for nausea, vomiting, abdominal pain and diarrhea.  Genitourinary: Negative for dysuria and flank pain.  Musculoskeletal: Negative for myalgias and joint pain.  Skin: Negative for itching and rash.  Neurological: Negative for dizziness, sensory change, focal weakness and headaches.   Objective:  Physical Exam: Filed Vitals:   12/24/15 1323  BP: 130/75    Pulse: 83  Temp: 98.1 F (36.7 C)  TempSrc: Oral  Height:  (1.778 m)  Weight: 302 lb 4.8 oz (137.122 kg)  SpO2: 97%   Physical Exam  Constitutional: He is oriented to person, place, and time. He appears well-developed and well-nourished. No distress.  HENT:  Head: Normocephalic and atraumatic.  Oropharynx is slightly erythematous. No oropharyngeal exudate. No tenderness on palpation of frontal, ethmoidal, and maxillary sinuses.  Eyes: EOM are normal. Pupils are equal, round, and reactive to light.  Neck: Neck supple.  Bilateral cervical lymphadenopathy  Cardiovascular: Normal rate, regular rhythm and intact distal pulses.  Exam reveals no gallop and no friction rub.   No murmur heard. Pulmonary/Chest: Effort normal. He has no rales.  Diffuse end expiratory wheezing.  Abdominal: Soft. Bowel sounds are normal. He exhibits no distension. There is no tenderness.  Musculoskeletal: Normal range of motion. He exhibits no edema or tenderness.  Neurological: He is alert and oriented to person, place, and time.  Skin: Skin is warm and dry. No rash noted. He is not diaphoretic. No erythema.   Assessment & Plan:

## 2015-12-26 NOTE — Assessment & Plan Note (Signed)
Assessment: During his previous visit, patient was prescribed Chantix. States he never took the medication because he heard about serious side effects. States he has been smoking 1.5 packs per day for the past 20 years. I spent a dedicated 5 minutes discussing the importance of smoking cessation with him. Patient stated he is ready to quit and wants to try something else. I discussed the option of nicotine patches plus gum and patient agreed.  Plan: -NicoDerm 21 mg per day patches for 6 weeks -Nicotine gum for cravings -Return to clinic in 6 weeks to assess progress. At this visit, will prescribe NicoDerm 14 mg per day for 2 weeks followed by 7 mg per day for 2 weeks.

## 2015-12-27 NOTE — Telephone Encounter (Signed)
Unable to reach patient for past 2 days to see if his blood sugars have increased due to the steroids he is taking.

## 2015-12-30 NOTE — Progress Notes (Signed)
Internal Medicine Clinic Attending  Case discussed with Dr. Rathoreat the time of the visit. We reviewed the resident's history and exam and pertinent patient test results. I agree with the assessment, diagnosis, and plan of care documented in the resident's note.  

## 2016-02-03 LAB — HM DIABETES EYE EXAM

## 2016-02-04 ENCOUNTER — Encounter: Payer: Self-pay | Admitting: Dietician

## 2016-02-04 ENCOUNTER — Ambulatory Visit (INDEPENDENT_AMBULATORY_CARE_PROVIDER_SITE_OTHER): Payer: Medicaid Other | Admitting: Internal Medicine

## 2016-02-04 ENCOUNTER — Encounter: Payer: Self-pay | Admitting: Internal Medicine

## 2016-02-04 ENCOUNTER — Ambulatory Visit (INDEPENDENT_AMBULATORY_CARE_PROVIDER_SITE_OTHER): Payer: Medicaid Other | Admitting: Dietician

## 2016-02-04 VITALS — BP 128/80 | HR 69 | Temp 98.3°F | Ht 70.0 in | Wt 311.4 lb

## 2016-02-04 DIAGNOSIS — Z7984 Long term (current) use of oral hypoglycemic drugs: Secondary | ICD-10-CM | POA: Diagnosis not present

## 2016-02-04 DIAGNOSIS — F17210 Nicotine dependence, cigarettes, uncomplicated: Secondary | ICD-10-CM | POA: Diagnosis not present

## 2016-02-04 DIAGNOSIS — F17200 Nicotine dependence, unspecified, uncomplicated: Secondary | ICD-10-CM

## 2016-02-04 DIAGNOSIS — E119 Type 2 diabetes mellitus without complications: Secondary | ICD-10-CM

## 2016-02-04 DIAGNOSIS — J4520 Mild intermittent asthma, uncomplicated: Secondary | ICD-10-CM | POA: Diagnosis not present

## 2016-02-04 DIAGNOSIS — Z6841 Body Mass Index (BMI) 40.0 and over, adult: Secondary | ICD-10-CM

## 2016-02-04 DIAGNOSIS — Z713 Dietary counseling and surveillance: Secondary | ICD-10-CM | POA: Diagnosis not present

## 2016-02-04 LAB — GLUCOSE, CAPILLARY: Glucose-Capillary: 112 mg/dL — ABNORMAL HIGH (ref 65–99)

## 2016-02-04 MED ORDER — NICOTINE 21 MG/24HR TD PT24: 21.0000 mg | MEDICATED_PATCH | TRANSDERMAL | Status: DC

## 2016-02-04 NOTE — Patient Instructions (Signed)
Please make an appointment with me in 3-4 months to follow up.  Can make it the same day you see a doctor like we did today.   Your insurance covers at 100%  2 hours of diabetes training every year.   Lupita LeashDonna  Diabetes Educator [phone]

## 2016-02-04 NOTE — Patient Instructions (Signed)
1. Apply one Nicotine patch to skin every day.  If you are not able to obtain the patches, call the clinic and let us know. 2. Chew the Nicotine gum as instructed. 3. Return to clinic in 4 weeks.  Nicotine chewing gum What is this medicine? NICOTINE (NIK oh teen) helps people stop smoking. This medicine replaces the nicotine found in cigarettes and helps to decrease withdrawal effects. It is most effective when used in combination with a stop-smoking program. This medicine may be used for other purposes; ask your health care provider or pharmacist if you have questions. What should I tell my health care provider before I take this medicine? They need to know if you have any of these conditions: -diabetes -heart disease, angina, irregular heartbeat or previous heart attack -high blood pressure -lung disease, including asthma -overactive thyroid -pheochromocytoma -seizures or history of seizures -stomach problems or ulcers -an unusual or allergic reaction to nicotine, other medicines, foods, dyes, or preservatives -pregnant or trying to get pregnant -breast-feeding How should I use this medicine? Chew but do not swallow the gum. Follow the directions that come with the chewing gum. Use exactly as directed. When you feel an urgent desire for a cigarette, chew one piece of gum slowly. Continue chewing until you taste the gum or feel a slight tingling in your mouth. Then, stop chewing and place the gum between your cheek and gum. Wait until the taste or tingling is almost gone then start chewing again. Continue chewing in this manner for about 30 minutes. Slow chewing helps reduce cravings and also helps reduce the chance for heartburn or other gastrointestinal side effects. Talk to your pediatrician regarding the use of this medicine in children. Special care may be needed. Overdosage: If you think you have taken too much of this medicine contact a poison control center or emergency room at  once. NOTE: This medicine is only for you. Do not share this medicine with others. What if I miss a dose? This does not apply. Only use the chewing gum when you have a strong desire to smoke. Do not use more than one piece of gum at a time. What may interact with this medicine? -medicines for asthma -medicines for blood pressure -medicines for mental depression This list may not describe all possible interactions. Give your health care provider a list of all the medicines, herbs, non-prescription drugs, or dietary supplements you use. Also tell them if you smoke, drink alcohol, or use illegal drugs. Some items may interact with your medicine. What should I watch for while using this medicine? Always carry the nicotine gum with you. Do not use more than 30 pieces of gum a day. Too much gum can increase the risk of an overdose. As the urge to smoke gets less, gradually reduce the number of pieces each day over a period of 2 to 3 months. When you are only using 1 or 2 pieces a day, stop using the nicotine gum. You should begin using the nicotine gum the day you stop smoking. It is okay if you do not succeed with the attempt to quit and have a cigarette. You can still continue your quit attempt and keep using the product as directed. Just throw away your cigarettes and get back to your quit plan. If your mouth gets sore from chewing the gum, suck hard sugarless candy between pieces of gum to help relieve the soreness. Brush your teeth regularly to reduce mouth irritation. If you wear dentures, contact  your doctor or health care professional if the gum sticks to your dental work. If you are a diabetic and you quit smoking, the effects of insulin may be increased and you may need to reduce your insulin dose. Check with your doctor or health care professional about how you should adjust your insulin dose. What side effects may I notice from receiving this medicine? Side effects that you should report to your  doctor or health care professional as soon as possible: -allergic reactions like skin rash, itching or hives, swelling of the face, lips, or tongue -blisters in mouth -breathing problems -changes in hearing -changes in vision -chest pain -cold sweats -confusion -fast, irregular heartbeat -feeling faint or lightheaded, falls -headache -increased saliva -nausea, vomiting -stomach pain -weakness Side effects that usually do not require medical attention (report to your doctor or health care professional if they continue or are bothersome): -diarrhea -dry mouth -hiccups -irritability -nervousness or restlessness -trouble sleeping or vivid dreams This list may not describe all possible side effects. Call your doctor for medical advice about side effects. You may report side effects to FDA at 1-800-FDA-1088. Where should I keep my medicine? Keep out of the reach of children. Store at room temperature between 15 and 30 degrees C (59 and 86 degrees F). Protect from heat and light. Throw away unused medicine after the expiration date. NOTE: This sheet is a summary. It may not cover all possible information. If you have questions about this medicine, talk to your doctor, pharmacist, or health care provider.    2016, [employer]vier/[employer] Standard. (2015-01-28 19:37:14)

## 2016-02-04 NOTE — Assessment & Plan Note (Signed)
Patient reports inability to stop smoking since last visit.  He was unable to obtain the Nicotine patches for unknown reason, stating the pharmacy never gave them to him.  The Nicotine gum did not help despite 6-7 pieces per day for 3-4 days.  However, he was also chewing it like normal gum.  He continues to smoke 1 ppd.  A/P: Tobacco use disorder.  Patient educated on proper use of Nicotine gum and counseled that it is only part of the therapy, and that the Nicotine patches are critical to the weaning process.  Will reorder Nicotine patches and patient counseled to call the clinic if he is unable to obtain them.   - Nicotine patch 21 mg QD x4 weeks. Continue Dr. Gardiner Rhymeathore's previously stated wean if patient adherent to regimen. - Nicotine gum PRN

## 2016-02-04 NOTE — Progress Notes (Signed)
Diabetes Self-Management Education  Visit Type: First/Initial  Appt. Start Time: 915 Appt. End Time: 947  02/04/2016  [name], identified by name and date of birth, is a 40 y.o. male with a diagnosis of Diabetes: Type 2.   ASSESSMENT  [name] wants help with quitting smoking. We discussed how to quit and what to do to prevent weight gain when quitting. He dislikes many vegetables, is allergic to shellfish. He likes to cook and is open to learning about healthy ways to eat and improve his nutrition. Assisted patient with meal planning- encouraging lower carb and calorie foods to prevent weight gain while quitting smoking  A1c is 5.8% Weight- 311.4#  BMI.- 44.7- obeses class III      Diabetes Self-Management Education - 02/04/16 0900    Visit Information   Visit Type First/Initial   Initial Visit   Diabetes Type Type 2   Are you currently following a meal plan? No   Are you taking your medications as prescribed? Yes   Health Coping   How would you rate your overall health? Good   Psychosocial Assessment   Patient Belief/Attitude about Diabetes Motivated to manage diabetes   Self-care barriers Lack of material resources   Self-management support Doctor's office;Family   Patient Concerns Quitting smoking   Special Needs None   Preferred Learning Style No preference indicated   Learning Readiness Ready   How often do you need to have someone help you when you read instructions, pamphlets, or other written materials from your doctor or pharmacy? 2 - Rarely   What is the last grade level you completed in school? 12   Pre-Education Assessment   Patient understands the diabetes disease and treatment process. Demonstrates understanding / competency   Patient understands incorporating nutritional management into lifestyle. Needs Review   Patient undertands incorporating physical activity into lifestyle. Demonstrates understanding / competency   Patient understands using  medications safely. Demonstrates understanding / competency   Patient understands monitoring blood glucose, interpreting and using results Demonstrates understanding / competency   Patient understands prevention, detection, and treatment of acute complications. Demonstrates understanding / competency   Patient understands prevention, detection, and treatment of chronic complications. Demonstrates understanding / competency   Patient understands how to develop strategies to address psychosocial issues. Demonstrates understanding / competency   Patient understands how to develop strategies to promote health/change behavior. Needs Review   Complications   Last HgB A1C per patient/outside source 5.8 %   How often do you check your blood sugar? 0 times/day (not testing)   Have you had a dilated eye exam in the past 12 months? Yes   Dietary Intake   Breakfast bacon one time a month, eggs, cheese, toast, water   Lunch Mostly chicken, little pork or beef, some fish, no milk   Snack (afternoon) grapes, honey buns, chips   Dinner chicken, corn, potatoes   Beverage(s) mostly water, 2 regular sugared sodas per day with lunch and dinner   Exercise   Exercise Type ADL's;Light (walking / raking leaves)   How many days per week to you exercise? 15   How many minutes per day do you exercise? 5   Total minutes per week of exercise 75   Patient Education   Nutrition management  Role of diet in the treatment of diabetes and the relationship between the three main macronutrients and blood glucose level;Food label reading, portion sizes and measuring food.   Personal strategies to promote health Review risk of  smoking and offered smoking cessation   Individualized Goals (developed by patient)   Nutrition General guidelines for healthy choices and portions discussed   Outcomes   Expected Outcomes Demonstrated interest in learning. Expect positive outcomes   Future DMSE Yearly;PRN   Program Status Completed       Individualized Plan for Diabetes Self-Management Training:   Learning Objective:  Patient will have a greater understanding of diabetes self-management. Patient education plan is to attend individual and/or group sessions per assessed needs and concerns.   Plan:   Patient Instructions  Please make an appointment with me in 3-4 months to follow up.  Can make it the same day you see a doctor like we did today.   Your insurance covers at 100%  2 hours of diabetes training every year.   Lupita Leash  Diabetes Educator [phone]      Expected Outcomes:  Demonstrated interest in learning. Expect positive outcomes  Education material provided: My Plate  If problems or questions, patient to contact team via:  Phone  Future DSME appointment: Yearly, PRN

## 2016-02-04 NOTE — Assessment & Plan Note (Signed)
Patient's weight continues to stay elevated/increase.  He reports he has not been exercising and his diet has been poor 2/2 deaths in the family.  He has also been unable to stop smoking.  A/P: Morbid obesity and DMII. - Referral to Nutrition and DM educator

## 2016-02-04 NOTE — Progress Notes (Signed)
Patient ID: Wayne Leonard Denise, male   DOB: 10/21/1975, 40 y.o.   MRN: [id number]   Subjective:   Patient ID: Wayne Leonard Wayne Leonard male   DOB: 09/13/1975 40 y.o.   MRN: [id number]  HPI: [name] is a 40 y.o. male with PMH as below, here for f/u tobacco cessation and asthma.  Please see Problem-Based charting for the status of the patient's chronic medical issues.     Past Medical History  Diagnosis Date  . Essential hypertension 11/23/2007  . Intermittent asthma, well controlled 11/23/2007  . Type 2 diabetes mellitus without complication, without long-term current use of insulin (HCC) 09/24/2015  . Hyperlipidemia 11/23/2007  . Obstructive sleep apnea 01/03/2008  . Pancreatitis, alcoholic, acute 2001   Current Outpatient Prescriptions  Medication Sig Dispense Refill  . albuterol (PROVENTIL HFA;VENTOLIN HFA) 108 (90 Base) MCG/ACT inhaler Inhale 2 puffs into the lungs every 6 (six) hours as needed for wheezing or shortness of breath. 1 Inhaler 6  . albuterol (PROVENTIL) (2.5 MG/3ML) 0.083% nebulizer solution Take 3 mLs (2.5 mg total) by nebulization every 6 (six) hours as needed for wheezing or shortness of breath. 75 mL 3  . amLODipine (NORVASC) 10 MG tablet Take 1 tablet (10 mg total) by mouth daily. 90 tablet 3  . atorvastatin (LIPITOR) 40 MG tablet Take 1 tablet (40 mg total) by mouth daily. 90 tablet 3  . lisinopril (PRINIVIL,ZESTRIL) 20 MG tablet Take 1 tablet (20 mg total) by mouth daily. 90 tablet 3  . metFORMIN (GLUCOPHAGE) 500 MG tablet Take 1 tablet (500 mg total) by mouth daily with breakfast. 90 tablet 3  . nicotine (NICODERM CQ - DOSED IN MG/24 HOURS) 21 mg/24hr patch Place 1 patch (21 mg total) onto the skin daily. 42 patch 0  . nicotine polacrilex (RA NICOTINE) 2 MG gum Chew 1 gum every 2 hours as needed (MAXIMUM 24 pieces/ day) 100 tablet 2  . predniSONE (DELTASONE) 10 MG tablet Take 4 tablets (40 mg) on day 1, then 3 tablets (30 mg) on day 2, then 2 tablets (20 mg) on day 3, then 1  tablet (10 mg) on day 4. 10 tablet 0   No current facility-administered medications for this visit.   Family History  Problem Relation Age of Onset  . Diabetes Mellitus II Mother   . Hypertension Mother   . Asthma Father   . Asthma Brother   . CAD Father    Social History   Social History  . Marital Status: Married    Spouse Name: N/A  . Number of Children: N/A  . Years of Education: N/A   Social History Main Topics  . Smoking status: Current Every Day Smoker -- 1.00 packs/day for 20 years    Types: Cigarettes  . Smokeless tobacco: Never Used  . Alcohol Use: No  . Drug Use: No  . Sexual Activity:    Partners: Female   Other Topics Concern  . None   Social History Narrative   Review of Systems: Denies CP, SOB, or wheezing. Objective:  Physical Exam: Filed Vitals:   02/04/16 0848  BP: 128/80  Pulse: 69  Temp: 98.3 F (36.8 C)  TempSrc: Oral  Height: 5\' 10"  (1.778 m)  Weight: 311 lb 6.4 oz (141.25 kg)  SpO2: 100%   Physical Exam  Constitutional: He is oriented to person, place, and time.  Obese male, sitting on bed, NAD  HENT:  Head: Normocephalic and atraumatic.  Eyes: EOM are normal. No scleral icterus.  Neck: No tracheal  deviation present.  Cardiovascular: Normal rate, regular rhythm and normal heart sounds.   Pulmonary/Chest: Effort normal. No stridor. No respiratory distress. He has no rales.  Minimal scattered wheezing.  Musculoskeletal: He exhibits no edema.  Neurological: He is alert and oriented to person, place, and time.  Skin: Skin is warm and dry.     Assessment & Plan:  Patient and case were discussed with Dr. Criselda Peaches.  Please refer to Problem Based charting for further documentation.

## 2016-02-04 NOTE — Assessment & Plan Note (Signed)
Patient denies recurrent symptoms since his last exacerbation.  He may use his inhaler twice a year.  He denies nighttime awakenings or functional limitations 2/2 asthma.  A/P: Mild Intermittent Asthma, well controlled on Albuterol inhaler PRN. - CTM

## 2016-02-09 NOTE — Progress Notes (Signed)
[facility] Attending  Case discussed with Dr. Taylor at the time of the visit.  We reviewed the resident's history and exam and pertinent patient test results.  I agree with the assessment, diagnosis, and plan of care documented in the resident's note. 

## 2016-03-10 ENCOUNTER — Encounter: Payer: Self-pay | Admitting: *Deleted

## 2016-03-23 ENCOUNTER — Telehealth: Payer: Self-pay | Admitting: Internal Medicine

## 2016-03-23 NOTE — Telephone Encounter (Signed)
APT. REMINDER CALL, LMTCB °

## 2016-03-24 ENCOUNTER — Encounter (INDEPENDENT_AMBULATORY_CARE_PROVIDER_SITE_OTHER): Payer: Self-pay

## 2016-03-24 ENCOUNTER — Encounter: Payer: Self-pay | Admitting: Internal Medicine

## 2016-03-24 ENCOUNTER — Ambulatory Visit (INDEPENDENT_AMBULATORY_CARE_PROVIDER_SITE_OTHER): Payer: Medicaid Other | Admitting: Internal Medicine

## 2016-03-24 ENCOUNTER — Encounter: Payer: Medicaid Other | Admitting: Dietician

## 2016-03-24 VITALS — BP 154/87 | HR 80 | Temp 98.4°F | Ht 70.0 in | Wt 311.9 lb

## 2016-03-24 DIAGNOSIS — I10 Essential (primary) hypertension: Secondary | ICD-10-CM | POA: Diagnosis not present

## 2016-03-24 DIAGNOSIS — F17200 Nicotine dependence, unspecified, uncomplicated: Secondary | ICD-10-CM

## 2016-03-24 DIAGNOSIS — Z7984 Long term (current) use of oral hypoglycemic drugs: Secondary | ICD-10-CM | POA: Diagnosis not present

## 2016-03-24 DIAGNOSIS — J4520 Mild intermittent asthma, uncomplicated: Secondary | ICD-10-CM

## 2016-03-24 DIAGNOSIS — G4733 Obstructive sleep apnea (adult) (pediatric): Secondary | ICD-10-CM | POA: Diagnosis not present

## 2016-03-24 DIAGNOSIS — Z79899 Other long term (current) drug therapy: Secondary | ICD-10-CM

## 2016-03-24 DIAGNOSIS — F17210 Nicotine dependence, cigarettes, uncomplicated: Secondary | ICD-10-CM

## 2016-03-24 DIAGNOSIS — Z0000 Encounter for general adult medical examination without abnormal findings: Secondary | ICD-10-CM

## 2016-03-24 DIAGNOSIS — E119 Type 2 diabetes mellitus without complications: Secondary | ICD-10-CM

## 2016-03-24 LAB — POCT GLYCOSYLATED HEMOGLOBIN (HGB A1C): HEMOGLOBIN A1C: 6.2

## 2016-03-24 LAB — GLUCOSE, CAPILLARY: GLUCOSE-CAPILLARY: 95 mg/dL (ref 65–99)

## 2016-03-24 NOTE — Patient Instructions (Signed)
-   Start using Nicotine patches and gum as discussed today.  -Return for a visit in 1 month for blood pressure recheck.

## 2016-03-25 DIAGNOSIS — Z0000 Encounter for general adult medical examination without abnormal findings: Secondary | ICD-10-CM | POA: Insufficient documentation

## 2016-03-25 NOTE — Assessment & Plan Note (Signed)
A Patient had an exacerbation 3 months ago and was treated with a steroid taper at that time. Reports feeling well at present and states he has used his rescue inhaler only 2-3 times since then.  P -Continue current mgmt: Albuterol MDI and nebulizer

## 2016-03-25 NOTE — Assessment & Plan Note (Addendum)
A Patient was prescribed Nicotine patches and gum 3 months ago. States he still continues to smoke 1PPD. He picked up the patches from the pharmacy but has not used them yet because he was concerned about all the side effects he read on the label. However, continues to use the gum.   P -Discussed the dangers of smoking. Patient seems motivated to quit. Explained to him that the gum is a supplement to the patches and won't help on its own.  -CTM progress at next visit in 1 month. If patient has successfully used Nicoderm 21 mg patches, prescribe Nicoderm 14 mg x 2 weeks followed by 7 mg x 2 weeks.

## 2016-03-25 NOTE — Progress Notes (Signed)
Medicine attending: Medical history, presenting problems, physical findings, and medications, reviewed with resident physician Dr Vasundrha Rathore on the day of the patient visit and I concur with her evaluation and management plan. 

## 2016-03-25 NOTE — Assessment & Plan Note (Signed)
Patient refused Tetanus shot and HIV testing offered at this visit.

## 2016-03-25 NOTE — Assessment & Plan Note (Signed)
Lab Results  Component Value Date   HGBA1C 6.2 03/24/2016   HGBA1C 5.8 09/24/2015     Assessment: Diabetes control:  well controlled (below goal <7) Comments: A1c has gone up since last visit and patient has not lost any weight. He is currently using Metformin 500 mg daily. Eye exam done in 01/2016 with no evidence of retinopathy. Urine micro albumin/ Cr ratio normal 6 months ago (patient is already on an ACEi).   Plan: Medications:  continue current medications Instruction/counseling given: reminded to bring medications to each visit, discussed foot care, discussed the need for weight loss and discussed diet Other plans: -I spent a dedicated 5 minutes discussing healthy eating, exercise, and the need for weight loss. Patient seems motivated to lose weight. As such, will not increase dose of Metformin at this time. However, if weight still up at next visit, will consider increasing dose.  -Foot exam  -Repeat A1c in 6 months

## 2016-03-25 NOTE — Progress Notes (Signed)
   CC: Patient is here to discuss his HTN, asthma, OSA, DM2, and tobacco use.   HPI:  Wayne Leonard is a 40 y.o. M with a PMHx of conditions listed below presenting to the clinic to discuss his HTN, asthma, OSA, DM2, and tobacco use. Please see problem based charting for the status of the patient's chronic medical conditions.   Past Medical History:  Diagnosis Date  . Essential hypertension 11/23/2007  . Hyperlipidemia 11/23/2007  . Intermittent asthma, well controlled 11/23/2007  . Obstructive sleep apnea 01/03/2008  . Pancreatitis, alcoholic, acute 2001  . Type 2 diabetes mellitus without complication, without long-term current use of insulin (HCC) 09/24/2015    Review of Systems:  Pertinent positives mentioned in HPI. Remainder of all ROS negative.   Physical Exam:  Vitals:   03/24/16 1313  BP: (!) 154/87  Pulse: 80  Temp: 98.4 F (36.9 C)  TempSrc: Oral  SpO2: 100%  Weight: (!) 311 lb 14.4 oz (141.5 kg)  Height: 5\' 10"  (1.778 m)   Physical Exam  Constitutional: He is oriented to person, place, and time. He appears well-developed and well-nourished. No distress.  Obese  HENT:  Head: Normocephalic and atraumatic.  Mouth/Throat: Oropharynx is clear and moist.  Eyes: EOM are normal. Pupils are equal, round, and reactive to light.  Neck: Neck supple. No tracheal deviation present.  Cardiovascular: Normal rate, regular rhythm and intact distal pulses.   Pulmonary/Chest: Effort normal and breath sounds normal. No respiratory distress. He has no wheezes. He has no rales.  Abdominal: Soft. Bowel sounds are normal. He exhibits no distension. There is no tenderness. There is no guarding.  Musculoskeletal: Normal range of motion. He exhibits no edema or deformity.  Neurological: He is alert and oriented to person, place, and time.  Skin: Skin is warm and dry.    Assessment & Plan:   See Encounters Tab for problem based charting.  Patient discussed with Dr. Cyndie ChimeGranfortuna

## 2016-03-25 NOTE — Assessment & Plan Note (Addendum)
BP Readings from Last 3 Encounters:  03/24/16 (!) 154/87  02/04/16 128/80  12/24/15 130/75    Lab Results  Component Value Date   NA 142 09/24/2015   K 4.4 09/24/2015   CREATININE 1.10 09/24/2015    Assessment: Blood pressure control:  above goal (<140/90) Comments: BP reading high today; previously well controlled. He is currently on Amlodipine 10 mg daily and Lisinopril 20 mg daily. Reports being compliant with his medications.   Plan: Medications:  continue current medications Educational resources provided:   Discussed healthy eating and exercise. Emphasized the importance of weight loss.  Other plans:  -Recheck at next visit in 1 month

## 2016-03-25 NOTE — Assessment & Plan Note (Signed)
A Denies having any complaints at present - no daytime somnolence/ fatigue. Reports not being able to tolerate CPAP.  P -Emphasized the importance of weight loss (he has not lost any weight since his last visit).

## 2016-07-14 ENCOUNTER — Encounter: Payer: Medicaid Other | Admitting: Internal Medicine

## 2016-07-14 ENCOUNTER — Encounter: Payer: Self-pay | Admitting: Internal Medicine

## 2016-07-15 NOTE — Progress Notes (Deleted)
   CC: ***  HPI:  [name] is a 40 y.o.   Past Medical History:  Diagnosis Date  . Essential hypertension 11/23/2007  . Hyperlipidemia 11/23/2007  . Intermittent asthma, well controlled 11/23/2007  . Obstructive sleep apnea 01/03/2008  . Pancreatitis, alcoholic, acute 2001  . Type 2 diabetes mellitus without complication, without long-term current use of insulin (HCC) 09/24/2015    Review of Systems:  ***  Physical Exam:  There were no vitals filed for this visit. ***  Assessment & Plan:   See Encounters Tab for problem based charting.  Patient {GC/GE:3044014::"discussed with","seen with"} Dr. {NAMES:[id number]::"Butcher","Granfortuna","E. Hoffman","Klima","Mullen","Narendra","Vincent"}

## 2017-04-06 ENCOUNTER — Encounter: Payer: Self-pay | Admitting: Internal Medicine

## 2017-11-28 ENCOUNTER — Other Ambulatory Visit: Payer: Self-pay

## 2017-11-28 ENCOUNTER — Inpatient Hospital Stay (HOSPITAL_COMMUNITY)
Admission: EM | Admit: 2017-11-28 | Discharge: 2017-11-30 | DRG: 638 | Disposition: A | Discharge status: Home / Self Care | Payer: Medicaid Other | Attending: Internal Medicine | Admitting: Internal Medicine

## 2017-11-28 ENCOUNTER — Encounter (HOSPITAL_COMMUNITY): Payer: Self-pay | Admitting: Emergency Medicine

## 2017-11-28 DIAGNOSIS — J45909 Unspecified asthma, uncomplicated: Secondary | ICD-10-CM

## 2017-11-28 DIAGNOSIS — Z825 Family history of asthma and other chronic lower respiratory diseases: Secondary | ICD-10-CM

## 2017-11-28 DIAGNOSIS — E1110 Type 2 diabetes mellitus with ketoacidosis without coma: Principal | ICD-10-CM | POA: Diagnosis present

## 2017-11-28 DIAGNOSIS — E876 Hypokalemia: Secondary | ICD-10-CM | POA: Diagnosis present

## 2017-11-28 DIAGNOSIS — Z6841 Body Mass Index (BMI) 40.0 and over, adult: Secondary | ICD-10-CM

## 2017-11-28 DIAGNOSIS — G4733 Obstructive sleep apnea (adult) (pediatric): Secondary | ICD-10-CM

## 2017-11-28 DIAGNOSIS — F17210 Nicotine dependence, cigarettes, uncomplicated: Secondary | ICD-10-CM

## 2017-11-28 DIAGNOSIS — N179 Acute kidney failure, unspecified: Secondary | ICD-10-CM

## 2017-11-28 DIAGNOSIS — Z9114 Patient's other noncompliance with medication regimen: Secondary | ICD-10-CM

## 2017-11-28 DIAGNOSIS — I10 Essential (primary) hypertension: Secondary | ICD-10-CM

## 2017-11-28 DIAGNOSIS — E785 Hyperlipidemia, unspecified: Secondary | ICD-10-CM

## 2017-11-28 DIAGNOSIS — Z888 Allergy status to other drugs, medicaments and biological substances status: Secondary | ICD-10-CM

## 2017-11-28 DIAGNOSIS — E869 Volume depletion, unspecified: Secondary | ICD-10-CM | POA: Diagnosis present

## 2017-11-28 DIAGNOSIS — Z833 Family history of diabetes mellitus: Secondary | ICD-10-CM

## 2017-11-28 DIAGNOSIS — Z7984 Long term (current) use of oral hypoglycemic drugs: Secondary | ICD-10-CM

## 2017-11-28 DIAGNOSIS — J4520 Mild intermittent asthma, uncomplicated: Secondary | ICD-10-CM

## 2017-11-28 DIAGNOSIS — Z79899 Other long term (current) drug therapy: Secondary | ICD-10-CM

## 2017-11-28 DIAGNOSIS — F17200 Nicotine dependence, unspecified, uncomplicated: Secondary | ICD-10-CM | POA: Diagnosis present

## 2017-11-28 DIAGNOSIS — Z8249 Family history of ischemic heart disease and other diseases of the circulatory system: Secondary | ICD-10-CM

## 2017-11-28 HISTORY — DX: Type 2 diabetes mellitus with ketoacidosis without coma: E11.10

## 2017-11-28 LAB — URINALYSIS, ROUTINE W REFLEX MICROSCOPIC
BACTERIA UA: NONE SEEN
BILIRUBIN URINE: NEGATIVE
HGB URINE DIPSTICK: NEGATIVE
Ketones, ur: 80 mg/dL — AB
LEUKOCYTES UA: NEGATIVE
NITRITE: NEGATIVE
PH: 5 (ref 5.0–8.0)
Protein, ur: NEGATIVE mg/dL
Specific Gravity, Urine: 1.03 (ref 1.005–1.030)

## 2017-11-28 LAB — BASIC METABOLIC PANEL
ANION GAP: 19 — AB (ref 5–15)
Anion gap: 16 — ABNORMAL HIGH (ref 5–15)
Anion gap: 14 (ref 5–15)
BUN: 19 mg/dL (ref 6–20)
BUN: 13 mg/dL (ref 6–20)
BUN: 17 mg/dL (ref 6–20)
CHLORIDE: 106 mmol/L (ref 101–111)
CHLORIDE: 106 mmol/L (ref 101–111)
CO2: 14 mmol/L — ABNORMAL LOW (ref 22–32)
CO2: 14 mmol/L — AB (ref 22–32)
CO2: 16 mmol/L — ABNORMAL LOW (ref 22–32)
Calcium: 9.1 mg/dL (ref 8.9–10.3)
Calcium: 9.2 mg/dL (ref 8.9–10.3)
Calcium: 8.9 mg/dL (ref 8.9–10.3)
Chloride: 97 mmol/L — ABNORMAL LOW (ref 101–111)
Creatinine, Ser: 1.64 mg/dL — ABNORMAL HIGH (ref 0.61–1.24)
Creatinine, Ser: 1.42 mg/dL — ABNORMAL HIGH (ref 0.61–1.24)
Creatinine, Ser: 1.33 mg/dL — ABNORMAL HIGH (ref 0.61–1.24)
GFR calc Af Amer: 59 mL/min — ABNORMAL LOW (ref >60)
GFR calc Af Amer: >60 mL/min (ref >60)
GFR calc Af Amer: >60 mL/min (ref >60)
GFR calc non Af Amer: >60 mL/min (ref >60)
GFR calc non Af Amer: >60 mL/min (ref >60)
GFR, EST NON AFRICAN AMERICAN: 50 mL/min — AB (ref >60)
GLUCOSE: 316 mg/dL — AB (ref 65–99)
GLUCOSE: 273 mg/dL — AB (ref 65–99)
Glucose, Bld: 673 mg/dL (ref 65–99)
POTASSIUM: 5.2 mmol/L — AB (ref 3.5–5.1)
POTASSIUM: 4.1 mmol/L (ref 3.5–5.1)
Potassium: 5 mmol/L (ref 3.5–5.1)
SODIUM: 130 mmol/L — AB (ref 135–145)
Sodium: 136 mmol/L (ref 135–145)
Sodium: 136 mmol/L (ref 135–145)

## 2017-11-28 LAB — CBG MONITORING, ED
GLUCOSE-CAPILLARY: 486 mg/dL — AB (ref 65–99)
GLUCOSE-CAPILLARY: 440 mg/dL — AB (ref 65–99)
GLUCOSE-CAPILLARY: 233 mg/dL — AB (ref 65–99)
GLUCOSE-CAPILLARY: 147 mg/dL — AB (ref 65–99)
Glucose-Capillary: 496 mg/dL — ABNORMAL HIGH (ref 65–99)
Glucose-Capillary: 355 mg/dL — ABNORMAL HIGH (ref 65–99)
Glucose-Capillary: 282 mg/dL — ABNORMAL HIGH (ref 65–99)
Glucose-Capillary: 260 mg/dL — ABNORMAL HIGH (ref 65–99)
Glucose-Capillary: 198 mg/dL — ABNORMAL HIGH (ref 65–99)

## 2017-11-28 LAB — CBC
HEMATOCRIT: 44.5 % (ref 39.0–52.0)
Hemoglobin: 14.5 g/dL (ref 13.0–17.0)
MCH: 25.8 pg — ABNORMAL LOW (ref 26.0–34.0)
MCHC: 32.6 g/dL (ref 30.0–36.0)
MCV: 79.3 fL (ref 78.0–100.0)
Platelets: 190 10*3/uL (ref 150–400)
RBC: 5.61 MIL/uL (ref 4.22–5.81)
RDW: 15.6 % — AB (ref 11.5–15.5)
WBC: 7.5 10*3/uL (ref 4.0–10.5)

## 2017-11-28 LAB — HEMOGLOBIN A1C
Hgb A1c MFr Bld: 14.3 % — ABNORMAL HIGH (ref 4.8–5.6)
Mean Plasma Glucose: 363.71 mg/dL

## 2017-11-28 MED ORDER — DEXTROSE-NACL 5-0.45 % IV SOLN
INTRAVENOUS | Status: DC
  Administered 2017-11-28: 21:00:00 via INTRAVENOUS

## 2017-11-28 MED ORDER — SODIUM CHLORIDE 0.9 % IV SOLN
INTRAVENOUS | Status: DC
  Administered 2017-11-28: 4.3 [IU]/h via INTRAVENOUS
  Filled 2017-11-28: qty 1

## 2017-11-28 MED ORDER — POTASSIUM CHLORIDE 10 MEQ/100ML IV SOLN
10.0000 meq | INTRAVENOUS | Status: AC
  Administered 2017-11-28 – 2017-11-29 (×3): 10 meq via INTRAVENOUS
  Filled 2017-11-28 (×2): qty 100

## 2017-11-28 MED ORDER — ENOXAPARIN SODIUM 40 MG/0.4ML ~~LOC~~ SOLN
40.0000 mg | SUBCUTANEOUS | Status: DC
  Administered 2017-11-29: 40 mg via SUBCUTANEOUS
  Filled 2017-11-28 (×2): qty 0.4

## 2017-11-28 MED ORDER — ATORVASTATIN CALCIUM 40 MG PO TABS
40.0000 mg | ORAL_TABLET | Freq: Every day | ORAL | Status: DC
  Administered 2017-11-29 – 2017-11-30 (×2): 40 mg via ORAL
  Filled 2017-11-28 (×2): qty 1

## 2017-11-28 MED ORDER — SODIUM CHLORIDE 0.9 % IV SOLN
INTRAVENOUS | Status: DC
  Administered 2017-11-28 (×2): via INTRAVENOUS

## 2017-11-28 MED ORDER — AMLODIPINE BESYLATE 10 MG PO TABS
10.0000 mg | ORAL_TABLET | Freq: Every day | ORAL | Status: DC
  Administered 2017-11-29 – 2017-11-30 (×2): 10 mg via ORAL
  Filled 2017-11-28 (×2): qty 1

## 2017-11-28 MED ORDER — SODIUM CHLORIDE 0.9 % IV SOLN
INTRAVENOUS | Status: DC
  Administered 2017-11-28: 17:00:00 via INTRAVENOUS

## 2017-11-28 MED ORDER — SODIUM CHLORIDE 0.9 % IV BOLUS
1000.0000 mL | Freq: Once | INTRAVENOUS | Status: AC
  Administered 2017-11-28: 1000 mL via INTRAVENOUS

## 2017-11-28 MED ORDER — POTASSIUM CHLORIDE 10 MEQ/100ML IV SOLN
10.0000 meq | INTRAVENOUS | Status: AC
  Administered 2017-11-28 (×2): 10 meq via INTRAVENOUS
  Filled 2017-11-28 (×2): qty 100

## 2017-11-28 MED ORDER — ENOXAPARIN SODIUM 40 MG/0.4ML ~~LOC~~ SOLN: 40.0000 mg | SUBCUTANEOUS | Status: DC

## 2017-11-28 NOTE — ED Notes (Signed)
Attempted report and was told their charge nurse was trying to figure out who was getting the patient.

## 2017-11-28 NOTE — ED Notes (Signed)
Check CBG 147, RN Scott informed

## 2017-11-28 NOTE — ED Notes (Signed)
PAGED ADMITTING PER RN  

## 2017-11-28 NOTE — ED Triage Notes (Signed)
Pt. Stated, I started feeling bad on Monday April 8, [facility] and my sugar was 719, they gave me 3 shot of Insulin in my stomach. And a RX for Metformin.  Ive not had any energy and just don't feel good.

## 2017-11-28 NOTE — ED Notes (Signed)
Pt CBG was 496, notified Scarlett(RN)

## 2017-11-28 NOTE — ED Notes (Signed)
Called bed placement and they advised 6E would not be accepting anymore stepdown patients.

## 2017-11-28 NOTE — ED Notes (Signed)
Checked pt CBG 198, RN Scott informed

## 2017-11-28 NOTE — ED Notes (Signed)
CRITICAL GLUCOSE-673 notified Arline Aspindy RN

## 2017-11-28 NOTE — H&P (Addendum)
Date: 11/28/2017               Patient Name:  Wayne SchimkeSenaca Leonard MRN: [id number]  DOB: 12/23/1975 Age / Sex: 42 y.o., male   PCP: Patient, No Pcp Per         Medical Service: Internal Medicine Teaching Service         Attending Physician: Dr. Oswaldo DoneVincent, Marquita Palmsuncan Thomas, *    First Contact: Dr. Alinda MoneyMelvin Pager: [phone]  Second Contact: Dr. Obie DredgeBlum Pager: [phone]       After Hours (After 5p/  First Contact Pager: [phone]  weekends / holidays): Second Contact Pager: [phone]   Chief Complaint: Generalized Weakness  History of Present Illness: [name] is a 42 yo M with a history of Type 2 Diabetes, Asthma, OSA, HLD, and HTN who presented to the ED with complaints of generalized weakness and recent high blood sugar. He states that he initially began to notice polyuria and polydipsia about 2 weeks ago during a trip to [place]. He then started feeling progressive generalized weakness. [facility] and states he was told his blood sugar was "719" . He states he was given insulin and when his blood sugar had improved to the 400s he was sent home with a prescription for Metformin 500mg  BID. He states his symptoms of polyuria, polydipsia and weakness has persistent since that time. He endorses cough for the last 2 weeks that he attributes to allergies. He denies Fevers, lightheadedness, nausea, chest pain, SOB, Constipation, Diarrhea or abdominal pain.  Patient had previously well controlled diabetes and [facility]. He states that last year he spent 3 weeks in jail and couldn't get his medications. He then was on house arrest for 5 months and was unable to follow up. He also states he lost medicaid in the interim. He has been off all of his medications for about a year, but states he want to get reestablished with a PCP.  In the ED, vital signs stable with BP in 140s-150s Systolic, CBC WNL; BMP showed Na 130 (corrects to 139), Bicarb 14, AG 19, and Cr  1.64 (Baseline 1.1 4264yrs ago); U/A showed >500 Glu and 80 Ketones. Patient received 10mEq IV K x2, 1L NS Bolus, and was placed on Insulin gtt. Patient to be admitted for further workup and care.   Meds:  Current Meds  Medication Sig  . albuterol (PROVENTIL HFA;VENTOLIN HFA) 108 (90 Base) MCG/ACT inhaler Inhale 2 puffs into the lungs every 6 (six) hours as needed for wheezing or shortness of breath.  Marland Kitchen. albuterol (PROVENTIL) (2.5 MG/3ML) 0.083% nebulizer solution Take 3 mLs (2.5 mg total) by nebulization every 6 (six) hours as needed for wheezing or shortness of breath.  . metFORMIN (GLUCOPHAGE) 500 MG tablet Take 500 mg by mouth 2 (two) times daily.     Allergies: Allergies as of 11/28/2017 - Review Complete 11/28/2017  Allergen Reaction Noted  . Iodine     Past Medical History:  Diagnosis Date  . Essential hypertension 11/23/2007  . Hyperlipidemia 11/23/2007  . Intermittent asthma, well controlled 11/23/2007  . Obstructive sleep apnea 01/03/2008  . Pancreatitis, alcoholic, acute 2001  . Type 2 diabetes mellitus without complication, without long-term current use of insulin (HCC) 09/24/2015    Family History: Family History  Problem Relation Age of Onset  . Asthma Father   . CAD Father   . Diabetes Mellitus II Mother   . Hypertension Mother   .  Asthma Brother   - Reviewed on Admission  Social History:  Social History   Tobacco Use  . Smoking status: Current Every Day Smoker    Packs/day: 1.00    Years: 24.00    Pack years: 24.00    Types: Cigarettes  . Smokeless tobacco: Never Used  Substance Use Topics  . Alcohol use: Yes    Alcohol/week: 0.0 oz  . Drug use: No  - Reviewed on Admission  Review of Systems: A complete ROS was negative except as per HPI.  Physical Exam: Blood pressure (!) 159/68, pulse 92, temperature 98.2 F (36.8 C), temperature source Oral, resp. rate 18, height 5\' 10"  (1.778 m), weight 300 lb (136.1 kg), SpO2 98 %. Physical Exam  Constitutional: He  is oriented to person, place, and time. He appears well-developed and well-nourished. No distress.  HENT:  Head: Normocephalic and atraumatic.  Eyes: EOM are normal. Right eye exhibits no discharge. Left eye exhibits no discharge.  Cardiovascular: Normal rate, regular rhythm, normal heart sounds and intact distal pulses.  Pulmonary/Chest: Effort normal and breath sounds normal. No respiratory distress.  Abdominal: Soft. Bowel sounds are normal. He exhibits no distension. There is no tenderness.  Musculoskeletal: He exhibits no edema or deformity.  Neurological: He is alert and oriented to person, place, and time.  Skin: Skin is warm and dry.   Assessment & Plan by Problem: Wayne Leonard is a 42 yo M with a history of Type 2 Diabetes, Asthma, OSA, HLD, and HTN who presented to the ED with complaints of generalized weakness and recent high blood sugar.   Diabetic Ketoacidosis: Patient presenting with 2 weeks of polyuria/polydipsia and 1 week of generalized weakness. Recently seen at OSH where he was found to have BG >700 per patient and was treated with Insulin and discharge with metformin. In ED: Bicarb 14, AG 19, and Cr 1.64 (Baseline 1.1 51yrs ago); U/A showed >500 Glu and 80 Ketones. Patient received IV K x2, 1L NS Bolus, and was placed on Insulin gtt. - Continue insulin drip - Normal saline bolus x2, then 75 mL/hr until CBG less than 250 - Switch to D5-1/2 NS when 1 CBG less than 250 - Once anion gap closed 2, start CM diet and if able to eat, administer Lantus 25units - Continue insulin drip for 1-2 more hours, then discontinue and start SSI-S  - DC fluids if eating, drinking, and off insulin drip - Nothing by mouth  - BMET every 4 hours - CBG Q1H - Hgb A1c  AKI: Cr 1.64 (Baseline 1.1 64yrs ago) 2/2 volume depletion in the setting of DKA as above.  - IVF as above - BMPs as above  Hypertension: BP 140s-150s here. Previously on Amlodipine 10mg  Daily. Has not taken medication for  about a year. - Amlodipine 10mg  Daily  HLD: Previously on Atorvastatin 40mg  Daily - Atorvastatin 40mg  Daily  FEN: IVF as above, NPO until AG closed x2, then CM Diet. VTE ppx: Lovenox Code Status: FULL  Dispo: Admit patient to Observation with expected length of stay less than 2 midnights.  Signed: Beola Cord, MD 11/28/2017, 4:01 PM  Pager: [phone]

## 2017-11-28 NOTE — ED Provider Notes (Signed)
Emergency Department Provider Note   I have reviewed the triage vital signs and the nursing notes.   HISTORY  Chief Complaint Hyperglycemia and Fatigue   HPI Wayne Leonard is a 42 y.o. male with PMH of DM, HLD, and HTN presents to the emergency department for evaluation of weakness and hyperglycemia. The patient was on Metformin in the past but took himself off within the last year. He presented to an outside ED in the last week with hyperglycemia and was re-started on Metformin. He returns today with nausea, weakness, and hyperglycemia. Denies any fever, chills, or abdominal pain. No radiation of symptoms or modifying factors.    Past Medical History:  Diagnosis Date  . Essential hypertension 11/23/2007  . Hyperlipidemia 11/23/2007  . Intermittent asthma, well controlled 11/23/2007  . Obstructive sleep apnea 01/03/2008  . Pancreatitis, alcoholic, acute 2001  . Type 2 diabetes mellitus without complication, without long-term current use of insulin (HCC) 09/24/2015    Patient Active Problem List   Diagnosis Date Noted  . Diabetic ketoacidosis (HCC) 11/28/2017  . DKA (diabetic ketoacidoses) (HCC) 11/28/2017  . Healthcare maintenance 03/25/2016  . Type 2 diabetes mellitus without complication, without long-term current use of insulin (HCC) 09/24/2015  . Tobacco use disorder 09/24/2015  . Obstructive sleep apnea 01/03/2008  . Hyperlipidemia 11/23/2007  . Essential hypertension 11/23/2007  . Intermittent asthma, well controlled 11/23/2007    History reviewed. No pertinent surgical history.  Current Outpatient Rx  . Order #: [id number] Class: Normal  . Order #: [id number] Class: Normal  . Order #: [id number] Class: Historical Med  . Order #: [id number] Class: Normal  . Order #: [id number] Class: Normal  . Order #: [id number] Class: Normal  . Order #: [id number] Class: Normal  . Order #: [id number] Class: Normal  . Order #: [id number] Class: Normal    Allergies Iodine  Family History    Problem Relation Age of Onset  . Asthma Father   . CAD Father   . Diabetes Mellitus II Mother   . Hypertension Mother   . Asthma Brother     Social History Social History   Tobacco Use  . Smoking status: Current Every Day Smoker    Packs/day: 1.00    Years: 24.00    Pack years: 24.00    Types: Cigarettes  . Smokeless tobacco: Never Used  Substance Use Topics  . Alcohol use: Yes    Alcohol/week: 0.0 oz  . Drug use: No    Review of Systems  Constitutional: No fever/chills. Positive fatigue and hyperglycemia.  Eyes: No visual changes. ENT: No sore throat. Cardiovascular: Denies chest pain. Respiratory: Denies shortness of breath. Gastrointestinal: No abdominal pain. Positive nausea, no vomiting.  No diarrhea.  No constipation. Genitourinary: Negative for dysuria. Musculoskeletal: Negative for back pain. Skin: Negative for rash. Neurological: Negative for headaches, focal weakness or numbness.  10-point ROS otherwise negative.  ____________________________________________   PHYSICAL EXAM:  VITAL SIGNS: ED Triage Vitals  Enc Vitals Group     BP 11/28/17 1036 (!) 148/89     Pulse Rate 11/28/17 1036 96     Resp 11/28/17 1036 18     Temp 11/28/17 1036 98.2 F (36.8 C)     Temp Source 11/28/17 1036 Oral     SpO2 11/28/17 1036 98 %     Weight 11/28/17 1051 300 lb (136.1 kg)     Height 11/28/17 1051 5\' 10"  (1.778 m)     Pain Score 11/28/17 1051 0    Constitutional: Alert and oriented. Well appearing and in no  acute distress. Eyes: Conjunctivae are normal. Head: Atraumatic. Nose: No congestion/rhinnorhea. Mouth/Throat: Mucous membranes are dry.  Neck: No stridor.  Cardiovascular: Normal rate, regular rhythm. Good peripheral circulation. Grossly normal heart sounds.   Respiratory: Normal respiratory effort.  No retractions. Lungs CTAB. Gastrointestinal: Soft and nontender. No distention.  Musculoskeletal: No lower extremity tenderness nor edema. No gross  deformities of extremities. Neurologic:  Normal speech and language. No gross focal neurologic deficits are appreciated.  Skin:  Skin is warm, dry and intact. No rash noted.  ____________________________________________   LABS (all labs ordered are listed, but only abnormal results are displayed)  Labs Reviewed  BASIC METABOLIC PANEL - Abnormal; Notable for the following components:      Result Value   Sodium 130 (*)    Chloride 97 (*)    CO2 14 (*)    Glucose, Bld 673 (*)    Creatinine, Ser 1.64 (*)    GFR calc non Af Amer 50 (*)    GFR calc Af Amer 59 (*)    Anion gap 19 (*)    All other components within normal limits  CBC - Abnormal; Notable for the following components:   MCH 25.8 (*)    RDW 15.6 (*)    All other components within normal limits  URINALYSIS, ROUTINE W REFLEX MICROSCOPIC - Abnormal; Notable for the following components:   Color, Urine STRAW (*)    Glucose, UA >=500 (*)    Ketones, ur 80 (*)    Squamous Epithelial / LPF 0-5 (*)    All other components within normal limits  CBG MONITORING, ED - Abnormal; Notable for the following components:   Glucose-Capillary 496 (*)    All other components within normal limits  CBG MONITORING, ED - Abnormal; Notable for the following components:   Glucose-Capillary 486 (*)    All other components within normal limits  CBG MONITORING, ED - Abnormal; Notable for the following components:   Glucose-Capillary 440 (*)    All other components within normal limits  CBG MONITORING, ED - Abnormal; Notable for the following components:   Glucose-Capillary 355 (*)    All other components within normal limits  CBG MONITORING, ED - Abnormal; Notable for the following components:   Glucose-Capillary 282 (*)    All other components within normal limits  HIV ANTIBODY (ROUTINE TESTING)  BASIC METABOLIC PANEL  BASIC METABOLIC PANEL  BASIC METABOLIC PANEL  BASIC METABOLIC PANEL  HEMOGLOBIN A1C  I-STAT CHEM 8, ED    ____________________________________________  RADIOLOGY   None ____________________________________________   PROCEDURES  Procedure(s) performed:   .Critical Care Performed by: Maia Plan, MD Authorized by: Maia Plan, MD   Critical care provider statement:    Critical care time (minutes):  35   Critical care time was exclusive of:  Separately billable procedures and treating other patients and teaching time   Critical care was necessary to treat or prevent imminent or life-threatening deterioration of the following conditions:  Endocrine crisis and dehydration   Critical care was time spent personally by me on the following activities:  Blood draw for specimens, development of treatment plan with patient or surrogate, evaluation of patient's response to treatment, examination of patient, obtaining history from patient or surrogate, ordering and performing treatments and interventions, ordering and review of laboratory studies, ordering and review of radiographic studies, pulse oximetry, re-evaluation of patient's condition and review of old charts   I assumed direction of critical care for this patient from another provider  in my specialty: no       ____________________________________________   INITIAL IMPRESSION / ASSESSMENT AND PLAN / ED COURSE  Pertinent labs & imaging results that were available during my care of the patient were reviewed by me and considered in my medical decision making (see chart for details).  Patient presents to the ED with generalized weakness and hyperglycemia. Found to have DKA. No HHS. Patient started on IVF and insulin drip. Reviewed labs in detail which informed my decision making. Updated patient and family at bedside.   Discussed patient's case with Im team to request admission. Patient and family (if present) updated with plan. Care transferred to IM service.  I reviewed all nursing notes, vitals, pertinent old records, EKGs,  labs, imaging (as available).  ____________________________________________  FINAL CLINICAL IMPRESSION(S) / ED DIAGNOSES  Final diagnoses:  Diabetic ketoacidosis without coma associated with type 2 diabetes mellitus (HCC)     MEDICATIONS GIVEN DURING THIS VISIT:  Medications  insulin regular (NOVOLIN R,HUMULIN R) 100 Units in sodium chloride 0.9 % 100 mL (1 Units/mL) infusion (5.9 Units/hr Intravenous Rate/Dose Change 11/28/17 1716)  0.9 %  sodium chloride infusion ( Intravenous Stopped 11/28/17 1846)  0.9 %  sodium chloride infusion ( Intravenous New Bag/Given 11/28/17 1703)  dextrose 5 %-0.45 % sodium chloride infusion (has no administration in time range)  amLODipine (NORVASC) tablet 10 mg (has no administration in time range)  atorvastatin (LIPITOR) tablet 40 mg (has no administration in time range)  enoxaparin (LOVENOX) injection 40 mg (has no administration in time range)  potassium chloride 10 mEq in 100 mL IVPB (0 mEq Intravenous Stopped 11/28/17 1610)  sodium chloride 0.9 % bolus 1,000 mL (0 mLs Intravenous Stopped 11/28/17 1452)    Note:  This document was prepared using Dragon voice recognition software and may include unintentional dictation errors.  Alona Bene, MD Emergency Medicine    Long, Arlyss Repress, MD 11/28/17 [phone]

## 2017-11-29 ENCOUNTER — Other Ambulatory Visit: Payer: Self-pay

## 2017-11-29 ENCOUNTER — Telehealth: Payer: Self-pay

## 2017-11-29 ENCOUNTER — Encounter (HOSPITAL_COMMUNITY): Payer: Self-pay | Admitting: General Practice

## 2017-11-29 DIAGNOSIS — I10 Essential (primary) hypertension: Secondary | ICD-10-CM | POA: Diagnosis present

## 2017-11-29 DIAGNOSIS — E785 Hyperlipidemia, unspecified: Secondary | ICD-10-CM | POA: Diagnosis present

## 2017-11-29 DIAGNOSIS — Z7984 Long term (current) use of oral hypoglycemic drugs: Secondary | ICD-10-CM | POA: Diagnosis not present

## 2017-11-29 DIAGNOSIS — Z833 Family history of diabetes mellitus: Secondary | ICD-10-CM | POA: Diagnosis not present

## 2017-11-29 DIAGNOSIS — E1110 Type 2 diabetes mellitus with ketoacidosis without coma: Secondary | ICD-10-CM

## 2017-11-29 DIAGNOSIS — F17210 Nicotine dependence, cigarettes, uncomplicated: Secondary | ICD-10-CM | POA: Diagnosis present

## 2017-11-29 DIAGNOSIS — E876 Hypokalemia: Secondary | ICD-10-CM | POA: Diagnosis present

## 2017-11-29 DIAGNOSIS — Z79899 Other long term (current) drug therapy: Secondary | ICD-10-CM | POA: Diagnosis not present

## 2017-11-29 DIAGNOSIS — Z6841 Body Mass Index (BMI) 40.0 and over, adult: Secondary | ICD-10-CM | POA: Diagnosis not present

## 2017-11-29 DIAGNOSIS — G4733 Obstructive sleep apnea (adult) (pediatric): Secondary | ICD-10-CM | POA: Diagnosis present

## 2017-11-29 DIAGNOSIS — Z8249 Family history of ischemic heart disease and other diseases of the circulatory system: Secondary | ICD-10-CM | POA: Diagnosis not present

## 2017-11-29 DIAGNOSIS — N179 Acute kidney failure, unspecified: Secondary | ICD-10-CM | POA: Diagnosis present

## 2017-11-29 DIAGNOSIS — E869 Volume depletion, unspecified: Secondary | ICD-10-CM | POA: Diagnosis present

## 2017-11-29 DIAGNOSIS — Z9114 Patient's other noncompliance with medication regimen: Secondary | ICD-10-CM | POA: Diagnosis not present

## 2017-11-29 HISTORY — DX: Type 2 diabetes mellitus with ketoacidosis without coma: E11.10

## 2017-11-29 LAB — BASIC METABOLIC PANEL
ANION GAP: 9 (ref 5–15)
ANION GAP: 12 (ref 5–15)
Anion gap: 9 (ref 5–15)
BUN: 15 mg/dL (ref 6–20)
BUN: 14 mg/dL (ref 6–20)
BUN: 16 mg/dL (ref 6–20)
CALCIUM: 8.2 mg/dL — AB (ref 8.9–10.3)
CALCIUM: 8.3 mg/dL — AB (ref 8.9–10.3)
CALCIUM: 8.5 mg/dL — AB (ref 8.9–10.3)
CO2: 20 mmol/L — ABNORMAL LOW (ref 22–32)
CO2: 19 mmol/L — ABNORMAL LOW (ref 22–32)
CO2: 17 mmol/L — AB (ref 22–32)
Chloride: 106 mmol/L (ref 101–111)
Chloride: 109 mmol/L (ref 101–111)
Chloride: 102 mmol/L (ref 101–111)
Creatinine, Ser: 1.23 mg/dL (ref 0.61–1.24)
Creatinine, Ser: 1.14 mg/dL (ref 0.61–1.24)
Creatinine, Ser: 1.39 mg/dL — ABNORMAL HIGH (ref 0.61–1.24)
GFR calc Af Amer: >60 mL/min (ref >60)
GFR calc Af Amer: >60 mL/min (ref >60)
GLUCOSE: 260 mg/dL — AB (ref 65–99)
GLUCOSE: 144 mg/dL — AB (ref 65–99)
Glucose, Bld: 520 mg/dL (ref 65–99)
Potassium: 4.4 mmol/L (ref 3.5–5.1)
Potassium: 3.6 mmol/L (ref 3.5–5.1)
Potassium: 4.4 mmol/L (ref 3.5–5.1)
SODIUM: 135 mmol/L (ref 135–145)
SODIUM: 137 mmol/L (ref 135–145)
SODIUM: 131 mmol/L — AB (ref 135–145)

## 2017-11-29 LAB — HIV ANTIBODY (ROUTINE TESTING W REFLEX): HIV SCREEN 4TH GENERATION: NONREACTIVE

## 2017-11-29 LAB — CBG MONITORING, ED
GLUCOSE-CAPILLARY: 140 mg/dL — AB (ref 65–99)
GLUCOSE-CAPILLARY: 137 mg/dL — AB (ref 65–99)
GLUCOSE-CAPILLARY: 221 mg/dL — AB (ref 65–99)
Glucose-Capillary: 130 mg/dL — ABNORMAL HIGH (ref 65–99)
Glucose-Capillary: 142 mg/dL — ABNORMAL HIGH (ref 65–99)
Glucose-Capillary: 241 mg/dL — ABNORMAL HIGH (ref 65–99)

## 2017-11-29 LAB — GLUCOSE, CAPILLARY
GLUCOSE-CAPILLARY: 481 mg/dL — AB (ref 65–99)
GLUCOSE-CAPILLARY: 438 mg/dL — AB (ref 65–99)
GLUCOSE-CAPILLARY: 306 mg/dL — AB (ref 65–99)
Glucose-Capillary: 291 mg/dL — ABNORMAL HIGH (ref 65–99)
Glucose-Capillary: 557 mg/dL (ref 65–99)

## 2017-11-29 MED ORDER — SODIUM CHLORIDE 0.9 % IV BOLUS
1000.0000 mL | Freq: Once | INTRAVENOUS | Status: AC
  Administered 2017-11-29: 1000 mL via INTRAVENOUS

## 2017-11-29 MED ORDER — INSULIN ASPART 100 UNIT/ML ~~LOC~~ SOLN: 0.0000 [IU] | Freq: Three times a day (TID) | SUBCUTANEOUS | Status: DC

## 2017-11-29 MED ORDER — INSULIN GLARGINE 100 UNIT/ML ~~LOC~~ SOLN
25.0000 [IU] | Freq: Every day | SUBCUTANEOUS | Status: DC
  Filled 2017-11-29: qty 0.25

## 2017-11-29 MED ORDER — INSULIN ASPART 100 UNIT/ML ~~LOC~~ SOLN: 0.0000 [IU] | Freq: Every day | SUBCUTANEOUS | Status: DC

## 2017-11-29 MED ORDER — INSULIN ASPART 100 UNIT/ML ~~LOC~~ SOLN
25.0000 [IU] | SUBCUTANEOUS | Status: AC
  Administered 2017-11-29: 25 [IU] via SUBCUTANEOUS

## 2017-11-29 MED ORDER — INSULIN ASPART 100 UNIT/ML ~~LOC~~ SOLN
20.0000 [IU] | Freq: Once | SUBCUTANEOUS | Status: AC
  Administered 2017-11-29: 20 [IU] via SUBCUTANEOUS

## 2017-11-29 MED ORDER — INSULIN ASPART 100 UNIT/ML ~~LOC~~ SOLN
0.0000 [IU] | Freq: Every day | SUBCUTANEOUS | Status: DC
  Administered 2017-11-29: 4 [IU] via SUBCUTANEOUS

## 2017-11-29 MED ORDER — INSULIN ASPART 100 UNIT/ML ~~LOC~~ SOLN
0.0000 [IU] | Freq: Three times a day (TID) | SUBCUTANEOUS | Status: DC
  Administered 2017-11-30: 7 [IU] via SUBCUTANEOUS
  Administered 2017-11-30: 11 [IU] via SUBCUTANEOUS

## 2017-11-29 MED ORDER — LIVING WELL WITH DIABETES BOOK
Freq: Once | Status: AC
  Administered 2017-11-29: 19:00:00
  Filled 2017-11-29 (×2): qty 1

## 2017-11-29 MED ORDER — INSULIN GLARGINE 100 UNIT/ML ~~LOC~~ SOLN
25.0000 [IU] | Freq: Once | SUBCUTANEOUS | Status: AC
  Administered 2017-11-29: 25 [IU] via SUBCUTANEOUS
  Filled 2017-11-29: qty 0.25

## 2017-11-29 MED ORDER — INSULIN ASPART PROT & ASPART (70-30 MIX) 100 UNIT/ML ~~LOC~~ SUSP
20.0000 [IU] | Freq: Two times a day (BID) | SUBCUTANEOUS | Status: DC
  Administered 2017-11-29: 20 [IU] via SUBCUTANEOUS
  Filled 2017-11-29: qty 10

## 2017-11-29 MED ORDER — INSULIN ASPART 100 UNIT/ML ~~LOC~~ SOLN
0.0000 [IU] | Freq: Three times a day (TID) | SUBCUTANEOUS | Status: DC
  Administered 2017-11-29: 5 [IU] via SUBCUTANEOUS
  Filled 2017-11-29: qty 1

## 2017-11-29 MED ORDER — INSULIN GLARGINE 100 UNIT/ML ~~LOC~~ SOLN: 25.0000 [IU] | Freq: Every day | SUBCUTANEOUS | Status: DC

## 2017-11-29 MED ORDER — POTASSIUM CHLORIDE CRYS ER 20 MEQ PO TBCR
20.0000 meq | EXTENDED_RELEASE_TABLET | Freq: Once | ORAL | Status: AC
  Administered 2017-11-29: 20 meq via ORAL
  Filled 2017-11-29: qty 1

## 2017-11-29 MED ORDER — INSULIN STARTER KIT- SYRINGES (ENGLISH)
1.0000 | Freq: Once | Status: AC
  Administered 2017-11-29: 1
  Filled 2017-11-29 (×2): qty 1

## 2017-11-29 NOTE — Progress Notes (Signed)
Pt refuses to demonstrate insulin administration. He states, " That ain't going to work for me." I inquired about whether his wife would be able to administer insulin and pt says, "Yeah, she's going to have to." Will continue to encourage pt in self administration.

## 2017-11-29 NOTE — ED Notes (Signed)
Checked CBG 137, Building control surveyorN Scott informed

## 2017-11-29 NOTE — Progress Notes (Signed)
CRITICAL VALUE ALERT  Critical Value:  Glucose 520  Date & Time Notied:  11/29/2017 1513   Provider Notified: Dr. Alinda MoneyMelvin previously notified   Orders Received/Actions taken: Orders received

## 2017-11-29 NOTE — Progress Notes (Addendum)
Inpatient Diabetes Program Recommendations  AACE/ADA: New Consensus Statement on Inpatient Glycemic Control (2015)  Target Ranges:  Prepandial:   less than 140 mg/dL      Peak postprandial:   less than 180 mg/dL (1-2 hours)      Critically ill patients:  140 - 180 mg/dL  Results for Wayne Leonard, Wayne Leonard (MRN [id number]) as of 11/29/2017 09:00  Ref. Range 11/28/2017 20:55 11/28/2017 22:20 11/28/2017 23:27 11/29/2017 00:55 11/29/2017 02:16 11/29/2017 03:28 11/29/2017 04:34 11/29/2017 06:44 11/29/2017 08:14  Glucose-Capillary Latest Ref Range: 65 - 99 mg/dL 233 (H) 198 (H) 147 (H) 130 (H) 142 (H) 140 (H) 137 (H) 221 (H) 241 (H)  Results for Wayne Leonard, Wayne Leonard (MRN [id number]) as of 11/29/2017 09:00  Ref. Range 11/28/2017 11:34  Glucose Latest Ref Range: 65 - 99 mg/dL 673 (HH)  Hemoglobin A1C Latest Ref Range: 4.8 - 5.6 % 14.3 (H)    Review of Glycemic Control  Diabetes history: DM2 Outpatient Diabetes medications: Metformin 500 mg BID Current orders for Inpatient glycemic control: Novolog 0-15 units TID with meals, Novolog 0-5 units QHS  Inpatient Diabetes Program Recommendations:  HgbA1C: A1C 14.3% on 11/29/17 indicating an average glucose of 364 mg/dl over the past 2-3 months. Per MD notes, patient will discharge home on insulin. Patient has no insurance or PCP and will need affordable insulin (Novolin R, NPH, or 70/30) and will need assistance with arranging follow up.  NOTE: Ordered Living Well with DM book, insulin starter kit-syringe, patient education by bedside RNs on DM and insulin, RD consult, and CM consult. Since patient has no insurance will need affordable insulin regimen. Patient was initially on an insulin drip and has been transitioned to SQ insulin. Patient received Lantus 25 units at 2:59 am today and glucose 241 mg/dl at 8:14 am today. Recommend ordering 70/30 20 units BID (will provide a total of 28 units for basal and 12 units for meal coverage per day) starting with supper tonight. Also,  recommend patient remain inpatient today to determine insulin needs and for education on DM and insulin.  Will plan to see patient today.  Addendum 11/29/17@12 :45--Spoke with patient and his wife about diabetes and home regimen for diabetes control. Patient reports that he has had a DM2 hx for a long time and he use to take Metformin BID in the past. Patient reports that he went to jail in April 2018 and he was not given any DM medications while he was in jail. Patient reports that he was on house arrest for 5 months following release from prison and he was not following carb modified diet or able to go outside and exercise.  Patient states that last Monday he went to [facility] and was told his glucose was high and he was discharged on Metformin 500 mg BID. Patient reports that he was taking the Metformin as directed but his glucose was still elevated and he was having symptoms of hyperglycemia.  Patient has no insurance or PCP but reports that [facility] and he was told that he could follow up at the clinic.  Inquired about prior A1C and patient reports that his last A1C was 5.8% over 1 year ago when he was taking Metformin BID.  Discussed A1C results (14.3% on 11/29/17) and explained that his current A1C indicates an average glucose of 364 mg/dl over the past 2-3 months. Discussed glucose and A1C goals. Discussed importance of checking CBGs and maintaining good CBG control to prevent long-term  and short-term complications. Explained how hyperglycemia leads to damage within blood vessels which lead to the common complications seen with uncontrolled diabetes. Stressed to the patient the importance of improving glycemic control to prevent further complications from uncontrolled diabetes. Discussed impact of nutrition, exercise, stress, sickness, and medications on diabetes control. Patient reports that he does not like any vegtables except corn and potatoes.   Discussed carbohydrates, carbohydrate goals per day and meal, along with portion sizes. Informed patient he should be seen by RD while inpatient for further diet education. Discussed using insulin as an outpatient and patient reports that he does not understand why he needs insulin now because he was able to keep DM controlled with Metformin in the past. Discussed progression of DM2 and explained that due to A1C of 14.3% and noted hyperglycemia, he was going to need insulin for now to get DM controlled and decrease risk of complications from uncontrolled DM. Patient is agreeable to taking insulin at home. Discussed Lantus and Novolog as they were ordered this morning. Explained that due to the cost of Lantus and Novolog and the lack of insurance, it was recommended he be transitioned to 70/30 insulin. Informed patient that Novolin 70/30 can be purchased at [facility] for $25 per vial. Provided patient with handout information on Reli-On products and encouraged patient to go to [place] to get the Reli-On Prime glucometer for $9 and a box of 50 Reli-On test strips for $9. Discussed 70/30 insulin in detail (how to take it, when to take it). Reviewed and demonstrated how to draw up and administer insulin with vial and syringe. Patient and his wife were able to successfully demonstrate how to draw up and administer insulin with vial and syringe. Informed patient that RN will be asking him to self-administer insulin to ensure proper technique and ability to administer self insulin shots.  Encouraged patient to check his glucose as the doctor directs (but at least 2 times per day) and to keep a log book of glucose readings and insulin taken which he will need to take to doctor appointments. Explained how the doctor he follows up with can use the log book to continue to make insulin adjustments if needed. Discussed hypoglycemia along with treatment. Encouraged patient to purchase glucose tablets to have at all times if  needed. Informed patient he should be receiving a Living Well with Diabetes book and an Insulin Starter kit. Encouraged patient to read over information when received and ask RNs if he has questions.  Patient verbalized understanding of information discussed and he states that he has no further questions at this time related to diabetes.   Talked with Mendel Ryder, RN regarding patient conversation and education. RNs to provide ongoing basic DM education at bedside with this patient and engage patient to actively check blood glucose and administer insulin injections.    Thanks, Barnie Alderman, RN, MSN, CDE Diabetes Coordinator Inpatient Diabetes Program [phone] (Team Pager from 8am to 5pm)

## 2017-11-29 NOTE — Telephone Encounter (Signed)
Hospital TOC per Dr Alinda MoneyMelvin, discharge 11/29/2017, appt 12/06/2017.

## 2017-11-29 NOTE — Progress Notes (Signed)
Pt's wife successfully return demonstrated drawing up and administering insulin to pt.

## 2017-11-29 NOTE — Care Management Note (Signed)
Case Management Note  Patient Details  Name: Jolene SchimkeSenaca Vogl MRN: [id number] Date of Birth: 06/23/1976  Subjective/Objective:                    Action/Plan:  Patient with no PCP .   [facility] and [facility] not accepting new patients.   Spoke with Dr Alinda MoneyMelvin , they will follow patient in clinic .  Expected Discharge Date:                  Expected Discharge Plan:  Home/Self Care  In-House Referral:  Financial Counselor  Discharge planning Services  CM Consult, [facility], [facility], Medication Assistance  Post Acute Care Choice:  NA Choice offered to:  Patient  DME Arranged:  N/A DME Agency:  NA  HH Arranged:  NA HH Agency:  NA  Status of Service:  In process, will continue to follow  If discussed at Long Length of Stay Meetings, dates discussed:    Additional Comments:  Kingsley PlanWile, Marijah Larranaga Marie, RN 11/29/2017, 10:59 AM

## 2017-11-29 NOTE — Care Management Note (Signed)
Case Management Note  Patient Details  Name: Wayne Leonard MRN: [id number] Date of Birth: 10/28/1975  Subjective/Objective:                    Action/Plan: MATCH letter given and explained to wife. [facility] OP address and phone number given explained they will also help with DM supplies .      Wife voiced understanding   Expected Discharge Date:                  Expected Discharge Plan:  Home/Self Care  In-House Referral:  Financial Counselor  Discharge planning Services  CM Consult, [facility], Medication Assistance  Post Acute Care Choice:  NA Choice offered to:  Patient, Spouse  DME Arranged:  N/A DME Agency:  NA  HH Arranged:  NA HH Agency:  NA  Status of Service:  Completed, signed off  If discussed at Long Length of Stay Meetings, dates discussed:    Additional Comments:  Wayne Leonard, Wayne Fultz Marie, RN 11/29/2017, 11:40 AM

## 2017-11-29 NOTE — ED Notes (Signed)
Attempted report x1. 

## 2017-11-29 NOTE — ED Notes (Signed)
Checked CBG 140, RN Scott informed

## 2017-11-29 NOTE — ED Notes (Signed)
Ordered pt a carb modified breakfast tray to be sent to [facility] [facility].

## 2017-11-29 NOTE — ED Notes (Signed)
Glucose stabilizer discontinued.

## 2017-11-29 NOTE — ED Notes (Signed)
Checked CBG 221, RN Matt informed

## 2017-11-29 NOTE — ED Notes (Signed)
Dr. Nelson ChimesAmin called and advised to administer Lantus and then 2 hours later to stop the insulin drip. Dr. Nelson ChimesAmin also advised the pt can then eat at that time.

## 2017-11-29 NOTE — Progress Notes (Signed)
   Subjective: Mr. Rayburn MaBlackmon was seen in his ED bed this morning on rounds. He states he is feeling better than yesterday. He was asking for food and was told he had a diet ordered and would be getting breakfast as well as getting moved up to a room today. He again states he has never had a complication from his diabetes in the past. It was explained to him that now that he has had this complication and is at risk of having it again; he will be started on insulin therapy for management of his diabetes. He states he is willing to self administer injections. He was told that we will provide him with education on insulin administration. He denies chest pain or significant weakness today. He has no other questions or complaints this morning.  Objective:  Vital signs in last 24 hours: Vitals:   11/29/17 0611 11/29/17 0629 11/29/17 0631 11/29/17 0649  BP:   121/80 134/82  Pulse: 89 86  81  Resp:    15  Temp:    98.4 F (36.9 C)  TempSrc:    Oral  SpO2: 98% 98%  99%  Weight:      Height:       Physical Exam  Constitutional: He is oriented to person, place, and time. He appears well-developed and well-nourished.  Cardiovascular: Normal rate, regular rhythm, normal heart sounds and intact distal pulses.  Pulmonary/Chest: Effort normal.  Abdominal: Soft. Bowel sounds are normal. He exhibits no distension.  Musculoskeletal: He exhibits no edema or deformity.  Neurological: He is alert and oriented to person, place, and time.  Skin: Skin is warm and dry.   Assessment/Plan: 42 yo M with a history of Type 2 Diabetes, Asthma, OSA, HLD, and HTN who presented to the ED with complaints of generalized weakness and recent high blood sugar.   Diabetic Ketoacidosis: Patient presenting with 2 weeks of polyuria/polydipsia and 1 week of generalized weakness. Recently seen at OSH where he was found to have BG >700 per patient and was treated with Insulin and discharge with metformin. - A1c: 14.3 - DKA has  resolved at this time, patient has diet ordered and received 25 units of Lantus at 3am - IVF Discontinued - Consult to Diabetes Coordinator for Insulin administration education, Patient will need to be on insulin at home as he has now been shown to be ketosis prone and poorly cotrolled. - 70/30 20 units BID, starting tonight  AKI: Cr 1.64 (Baseline 1.1 two yrs ago) 2/2 volume depletion in the setting of DKA as above.  - resolved - Last Cr 1.14  Hypertension: BP 120s-150s Systolic here. Previously on Amlodipine 10mg  Daily. Has not taken medication for about a year. - Amlodipine 10mg  Daily  HLD: Previously on Atorvastatin 40mg  Daily - Atorvastatin 40mg  Daily  FEN: CM Diet. VTE ppx: Lovenox Code Status: FULL  Dispo: Anticipated discharge later today. [facility].  Beola CordMelvin, Alexander, MD 11/29/2017, 7:38 AM Pager: [phone]

## 2017-11-30 DIAGNOSIS — Z794 Long term (current) use of insulin: Secondary | ICD-10-CM

## 2017-11-30 DIAGNOSIS — E876 Hypokalemia: Secondary | ICD-10-CM

## 2017-11-30 LAB — BASIC METABOLIC PANEL
Anion gap: 10 (ref 5–15)
BUN: 12 mg/dL (ref 6–20)
CALCIUM: 8.6 mg/dL — AB (ref 8.9–10.3)
CHLORIDE: 106 mmol/L (ref 101–111)
CO2: 20 mmol/L — ABNORMAL LOW (ref 22–32)
CREATININE: 0.97 mg/dL (ref 0.61–1.24)
Glucose, Bld: 234 mg/dL — ABNORMAL HIGH (ref 65–99)
Potassium: 3.3 mmol/L — ABNORMAL LOW (ref 3.5–5.1)
SODIUM: 136 mmol/L (ref 135–145)

## 2017-11-30 LAB — GLUCOSE, CAPILLARY
GLUCOSE-CAPILLARY: 230 mg/dL — AB (ref 65–99)
GLUCOSE-CAPILLARY: 229 mg/dL — AB (ref 65–99)
Glucose-Capillary: 255 mg/dL — ABNORMAL HIGH (ref 65–99)

## 2017-11-30 MED ORDER — "INSULIN SYRINGE 30G X 5/16"" 1 ML MISC": 1.0000 | Freq: Two times a day (BID) | 3 refills | Status: AC

## 2017-11-30 MED ORDER — INSULIN ASPART PROT & ASPART (70-30 MIX) 100 UNIT/ML ~~LOC~~ SUSP: 35.0000 [IU] | Freq: Two times a day (BID) | SUBCUTANEOUS | Status: DC

## 2017-11-30 MED ORDER — BLOOD GLUCOSE MONITOR KIT: PACK | 0 refills | Status: DC

## 2017-11-30 MED ORDER — INSULIN ASPART PROT & ASPART (70-30 MIX) 100 UNIT/ML ~~LOC~~ SUSP: 40.0000 [IU] | Freq: Two times a day (BID) | SUBCUTANEOUS | Status: DC

## 2017-11-30 MED ORDER — AMLODIPINE BESYLATE 10 MG PO TABS: 10.0000 mg | ORAL_TABLET | Freq: Every day | ORAL | 3 refills | Status: DC

## 2017-11-30 MED ORDER — ALBUTEROL SULFATE HFA 108 (90 BASE) MCG/ACT IN AERS: 2.0000 | INHALATION_SPRAY | Freq: Four times a day (QID) | RESPIRATORY_TRACT | 6 refills | Status: DC | PRN

## 2017-11-30 MED ORDER — LISINOPRIL 20 MG PO TABS: 20.0000 mg | ORAL_TABLET | Freq: Every day | ORAL | 3 refills | Status: DC

## 2017-11-30 MED ORDER — INSULIN ASPART PROT & ASPART (70-30 MIX) 100 UNIT/ML ~~LOC~~ SUSP: 40.0000 [IU] | Freq: Two times a day (BID) | SUBCUTANEOUS | 11 refills | Status: DC

## 2017-11-30 MED ORDER — ATORVASTATIN CALCIUM 40 MG PO TABS: 40.0000 mg | ORAL_TABLET | Freq: Every day | ORAL | 3 refills | Status: DC

## 2017-11-30 MED ORDER — POTASSIUM CHLORIDE CRYS ER 20 MEQ PO TBCR
40.0000 meq | EXTENDED_RELEASE_TABLET | Freq: Once | ORAL | Status: AC
  Administered 2017-11-30: 40 meq via ORAL
  Filled 2017-11-30: qty 2

## 2017-11-30 MED ORDER — INSULIN ASPART PROT & ASPART (70-30 MIX) 100 UNIT/ML ~~LOC~~ SUSP
30.0000 [IU] | Freq: Two times a day (BID) | SUBCUTANEOUS | Status: DC
  Administered 2017-11-30: 30 [IU] via SUBCUTANEOUS

## 2017-11-30 MED ORDER — METFORMIN HCL 500 MG PO TABS: 500.0000 mg | ORAL_TABLET | Freq: Two times a day (BID) | ORAL | 0 refills | Status: DC

## 2017-11-30 MED FILL — ULTICARE SYR 1 ML 30GX5/16": 30G X 5/16" | 30 days supply | Qty: 60 | Fill #0

## 2017-11-30 MED FILL — ATORVASTATIN 40 MG TABLET: 40 | 30 days supply | Qty: 30 | Fill #0

## 2017-11-30 MED FILL — FREESTYLE LANCETS: 25 days supply | Qty: 100 | Fill #0

## 2017-11-30 MED FILL — FREESTYLE LITE TEST STRIP: 25 days supply | Qty: 100 | Fill #0

## 2017-11-30 MED FILL — ULTICARE SYR 1 ML 30GX5/16: 30G X 5/16" | 30 days supply | Qty: 60 | Fill #0

## 2017-11-30 MED FILL — LISINOPRIL 20 MG TABLET: 20 | 30 days supply | Qty: 30 | Fill #0

## 2017-11-30 MED FILL — NOVOLOG MIX 70/30 VIAL: (70-30) 100 | 13 days supply | Qty: 10 | Fill #0

## 2017-11-30 MED FILL — FREESTYLE LITE METER: 30 days supply | Qty: 1 | Fill #0

## 2017-11-30 MED FILL — AMLODIPINE BESYLATE 10 MG T: 10 | 30 days supply | Qty: 30 | Fill #0

## 2017-11-30 MED FILL — VENTOLIN HFA 90 MCG INHALER: 108 (90 BAS | 25 days supply | Qty: 18 | Fill #0

## 2017-11-30 MED FILL — metFORMIN HCL 500 MG TABS: 500 | 30 days supply | Qty: 60 | Fill #0

## 2017-11-30 NOTE — Progress Notes (Signed)
Discharge instructions reviewed with the Patient, and education provided to the patient about his new medications he was receiving.  Pt was instructed on where to pick up his prescriptions.  Pt has his prescription for kit - and will take to pharmacy.   Pt left ambulatory with his guest. Pt has all of his belonging with him at discharge

## 2017-11-30 NOTE — Progress Notes (Signed)
Inpatient Diabetes Program Recommendations  AACE/ADA: New Consensus Statement on Inpatient Glycemic Control (2015)  Target Ranges:  Prepandial:   less than 140 mg/dL      Peak postprandial:   less than 180 mg/dL (1-2 hours)      Critically ill patients:  140 - 180 mg/dL   Results for Wayne SchimkeBLACKMON, Wayne (MRN [id number]) as of 11/30/2017 08:22  Ref. Range 11/29/2017 08:14 11/29/2017 13:16 11/29/2017 14:39 11/29/2017 18:28 11/29/2017 21:40 11/29/2017 23:52 11/30/2017 04:36 11/30/2017 08:18  Glucose-Capillary Latest Ref Range: 65 - 99 mg/dL 191241 (H)  Novolog 5 units 557 (HH)  Novolog 25 units 481 (H)  Novolog 25 units 438 (H)  Novolog 20 units  70/30 20 units 306 (H)  Novolog 4 units 291 (H) 230 (H) 229 (H)   Review of Glycemic Control  Diabetes history: DM2 Outpatient Diabetes medications: Metformin 500 mg BID Current orders for Inpatient glycemic control: 70/30 30 units BID, Novolog 0-20 units TID with meals, Novolog 0-5 units QHS  Inpatient Diabetes Program Recommendations:  Insulin-Basal: Noted 70/30 was increased from 20 to 30 units BID today. Recommend increasing further to 70/30 35 units BID (will provide a total of 49 units for basal and 21 units for meal coverage). HgbA1C: A1C 14.3% on 11/29/17 indicating an average glucose of 364 mg/dl over the past 2-3 months. Per MD notes, patient will discharge home on insulin. Patient has no insurance or PCP and will need affordable insulin (Novolin R, NPH, or 70/30). Recommend discharging on NOVOLIN 70/30 BID and NOVOLIN Regular correction scale as needed (dosages comparable to inpatient regimen if glucose is well controlled).  Thanks, Orlando PennerMarie Ceilidh Torregrossa, RN, MSN, CDE Diabetes Coordinator Inpatient Diabetes Program [phone] (Team Pager from 8am to 5pm)

## 2017-11-30 NOTE — Progress Notes (Signed)
   Subjective: [name] was seen resting in his bed this morning. He states he is doing much better and is no longer urinating frequently or feeling thirsty. He also feels as though he has more energy. He has received his education on insulin administration, but has stated he does not want to inject himself. His wife received education as well and per notes will be administering his injections twice a day. Patient has no other questions or concerns this morning.  Objective:  Vital signs in last 24 hours: Vitals:   11/29/17 0950 11/29/17 1554 11/29/17 2143 11/30/17 0500  BP: (!) 154/87 (!) 163/95 138/81 (!) 145/95  Pulse: 83 99 88 83  Resp:   18 18  Temp: 98.4 F (36.9 C) 98.1 F (36.7 C) 97.7 F (36.5 C) 97.7 F (36.5 C)  TempSrc: Oral Oral Oral Oral  SpO2: 100% 99% 100% 97%  Weight:      Height:       Physical Exam  Constitutional: He is oriented to person, place, and time. He appears well-developed and well-nourished. No distress.  Obese, tired appearing male  HENT:  Head: Normocephalic and atraumatic.  Cardiovascular: Normal rate, regular rhythm, normal heart sounds and intact distal pulses.  Pulmonary/Chest: Effort normal and breath sounds normal. No respiratory distress.  Abdominal: Soft. Bowel sounds are normal. He exhibits no distension. There is no tenderness.  Musculoskeletal: He exhibits no edema or deformity.  Neurological: He is alert and oriented to person, place, and time.  Skin: Skin is warm and dry.   Assessment/Plan: 42 yo M with a history of Type 2 Diabetes, Asthma, OSA, HLD, and HTN who presented to the ED with complaints of generalized weakness and recent high blood sugar.   Uncontrolled Type II Diabetes Diabetic Ketoacidosis: Patient presenting with 2 weeks of polyuria/polydipsia and 1 week of generalized weakness. Recently seen at OSH where he was found to have BG >700 per patient and was treated with Insulin and discharge with metformin. > Ketoacidosis  resolved > Patient and wife received education on insulin administration. > A1c: 14.3 - 70/30 40 units BID, starting tonight; BG in 250s on 30Units 70/30  AKI: Cr 1.64 (Baseline 1.1 two yrs ago) 2/2 volume depletion in the setting of DKA as above. - Last Cr 0.97 (4/16)  Hypokalemia: 3.3 on BMP this AM - Replete with 40mEq  Hypertension: BP 120s-150s Systolic here. Previously on Amlodipine 10mg  Daily. Has not taken medication for about a year. - Amlodipine 10mg  Daily  HLD: Previously on Atorvastatin 40mg  Daily - Atorvastatin 40mg  Daily  FEN: CM Diet. VTE ppx: Lovenox Code Status: FULL  Dispo: Anticipated discharge later today. [facility].  Beola CordMelvin, Alexander, MD 11/30/2017, 1:43 PM Pager: [phone]

## 2017-11-30 NOTE — Plan of Care (Signed)
  RD consulted for nutrition education regarding diabetes.   Lab Results  Component Value Date   HGBA1C 14.3 (H) 11/28/2017   PTA DM medications 500 mg metformin BID Inpatient orders for glycemic control 0-20 units insulin aspart TID with meals, 0-5 units insulin aspart q HS, and 40 units insulin aspart protamine- aspart BID. CBGS: 229-291.   Spoke with pt at bedside, who reports he has had DM for multiple years. He reports very good control up until the past few months. He reports he checks CBGS at home. Pt reports he consumes 2-3 meals per day (sometimes skips lunch). Typical intake includes- breakfast: egg and sausage biscuit with orange juice, Lunch: fast food or leftover from home, Dinner: meat, starch, and vegetable (ex chicken, corn, and rice). Pt reports commonly consumed beverages are water, soda, and juice.   He was not very engaged during sessions, however, had multiple questions regarding alcohol intake with insulin use. Pt reports he consumes 2 alcoholic beverages 1-2 times per month (usually orange juice mixed with tequila). Discussed importance of moderation of alcohol consumption, as well as consuming alcoholic beverages with meals and monitoring blood sugars before bedtime to decrease risk of hypoglycemia). Pt reports he is very anxious to administer insulin injections and has been having his wife do this task. Discussed with pt importance of rotation of injection sites.   RD provided "Carbohydrate Counting for People with Diabetes" handout from the Academy of Nutrition and Dietetics. Discussed different food groups and their effects on blood sugar, emphasizing carbohydrate-containing foods. Provided list of carbohydrates and recommended serving sizes of common foods.  Discussed importance of controlled and consistent carbohydrate intake throughout the day. Provided examples of ways to balance meals/snacks and encouraged intake of high-fiber, whole grain complex carbohydrates. Teach  back method used.  Expect fair to poor compliance.  Body mass index is 43.05 kg/m. Pt meets criteria for extreme obesity, class III based on current BMI.  Current diet order is Carb Modified, patient is consuming approximately 100% of meals at this time. Labs and medications reviewed. No further nutrition interventions warranted at this time. RD contact information provided. If additional nutrition issues arise, please re-consult RD.  Rhilee Currin A. Mayford KnifeWilliams, RD, LDN, CDE Pager: [phone] After hours Pager: [phone]

## 2017-11-30 NOTE — Telephone Encounter (Signed)
Patient remains inpatient on 11/30/2017. Expected discharge later today. Kinnie FeilL. Ducatte, RN, BSN

## 2017-12-01 NOTE — Discharge Summary (Signed)
-  Name: Wayne Leonard MRN: [id number] DOB: [birth year] 42 y.o. PCP: Patient, No Pcp Per  Date of Admission: 11/28/2017 10:20 AM Date of Discharge: 11/30/2017 Attending Physician: Oda Kilts, MD  Discharge Diagnosis:  Principal Problem:   Diabetic ketoacidosis ([place]) Active Problems:   Essential hypertension   Obstructive sleep apnea   Type II diabetes mellitus with ketoacidosis, uncontrolled ([place])   Tobacco use disorder   DKA (diabetic ketoacidoses) ([place])   Morbid obesity ([place])  Discharge Medications: Allergies as of 11/30/2017      Reactions   Iodine    Unknown was told at the hospital he was allergic.      Medication List    STOP taking these medications   nicotine 21 mg/24hr patch Commonly known as:  NICODERM CQ - dosed in mg/24 hours   nicotine polacrilex 2 MG gum Commonly known as:  RA NICOTINE     TAKE these medications   albuterol (2.5 MG/3ML) 0.083% nebulizer solution Commonly known as:  PROVENTIL Take 3 mLs (2.5 mg total) by nebulization every 6 (six) hours as needed for wheezing or shortness of breath.   albuterol 108 (90 Base) MCG/ACT inhaler Commonly known as:  PROVENTIL HFA;VENTOLIN HFA Inhale 2 puffs into the lungs every 6 (six) hours as needed for wheezing or shortness of breath.   amLODipine 10 MG tablet Commonly known as:  NORVASC Take 1 tablet (10 mg total) by mouth daily.   atorvastatin 40 MG tablet Commonly known as:  LIPITOR Take 1 tablet (40 mg total) by mouth daily.   blood glucose meter kit and supplies Kit Dispense based on patient and insurance preference. Use up to four times daily as directed. (FOR ICD-9 250.00, 250.01).   insulin aspart protamine- aspart (70-30) 100 UNIT/ML injection Commonly known as:  NOVOLOG MIX 70/30 Inject 0.4 mLs (40 Units total) into the skin 2 (two) times daily with a meal.   INSULIN SYRINGE 1CC/30GX5/16" 30G X 5/16" 1 ML Misc 1 each by Does not apply route 2 (two) times daily.   lisinopril 20  MG tablet Commonly known as:  PRINIVIL,ZESTRIL Take 1 tablet (20 mg total) by mouth daily.   metFORMIN 500 MG tablet Commonly known as:  GLUCOPHAGE Take 1 tablet (500 mg total) by mouth 2 (two) times daily. What changed:  Another medication with the same name was removed. Continue taking this medication, and follow the directions you see here.       Disposition and follow-up:   Wayne Leonard was discharged from [facility] in Stable condition.  At the hospital follow up visit please address:  Uncontrolled Type II Diabetes Diabetic Ketoacidosis: - A1c: 14.3 - Currently unisured - Discharged on Metformin 546m BID and 40 Units BID of 70/30 Insulin, with CBGs in 200s - Patient hesitant to inject, but wife will inject > ensure adherence - Patient provided Rx for Meter and supplies, adjust dose based on readings  AKI: Hypokalemia:  - AKI resolved and Hypokalemia was repleted - Repeat BMP  Hypertension: - BP was 1923R-007MSystolic on day of discharge on Amlodipine 142mDaily - Previously on Amlodipine 1041maily, which was restarted during admission - Lisinopril 29m10mily restarted on discharge - Adjust as indicated - BMP  HLD: - Restarted on Atorvastatin 40mg40mly while admitted  Hx of Asthma: - Albuterol refilled at discharge  2.  Labs / imaging needed at time of follow-up: BMP  3.  Pending labs/ test needing follow-up: None  Follow-up Appointments:  Hospital Course by problem list:   Uncontrolled Type II Diabetes Diabetic Ketoacidosis:Patient presented with 2 weeks of polyuria/polydipsia and 1 week of generalized weakness. He had been seen 1 week prior at an outside hospital where he states he was found to have BG >700 insulin and was treated with Insulin and discharge with prescription for metformin. In the ED he was found to have blood glucose >700, with ketones in his urine. He was started on DKA protocol. He improved overnight and was  able to come of insulin drip and fluids with CBGs in the 140s-160s. That day CBG showed blood glucose returned to the 500s while on long acting insulin alone. He was treated with short acting insulin and transitioned to 70/30 (as this would be affordable with his lack of insurance). His does of 70/30 Insulin was titrated and he was discharge on 40Units BID with CBGs in the 200s and follow up scheduled for the following Monday for further titration. He was also discharged on Metformin 520m BID and prescribed glucometer and supplies as well.  AKI: Cr on admission was 1.64 (up from baseline of 1.1 two years prior). This was due to volume depletion in the setting of DKA. Cr improved with IVFs and was 0.97 on day of discharge.  Hypertension: BP [id number] while admitted. Previously on Amlodipine 147mDaily and Lisinopril 2022maily. Has not taken medication for about a year.Amlodipine restarted this admission and Lisinopril restarted on discharge.  Discharge Vitals:   BP (!) 150/88   Pulse 89   Temp 97.9 F (36.6 C) (Oral)   Resp 16   Ht _0  (1.778 m)   Wt 300 lb (136.1 kg)   SpO2 100%   BMI 43.05 kg/m   Pertinent Labs, Studies, and Procedures:  CBC Latest Ref Rng & Units 11/28/2017 09/24/2015 10/16/2007  WBC 4.0 - 10.5 K/uL 7.5 9.4 10.6(H)  Hemoglobin 13.0 - 17.0 g/dL 14.5 14.8 14.6  Hematocrit 39.0 - 52.0 % 44.5 45.8 44.9  Platelets 150 - 400 K/uL 190 283 215   BMP Latest Ref Rng & Units 11/30/2017 11/29/2017 11/29/2017  Glucose 65 - 99 mg/dL 234(H) 520(HH) 144(H)  BUN 6 - 20 mg/dL _1 Creatinine 0.61 - 1.24 mg/dL 0.97 1.39(H) 1.14  BUN/Creat Ratio 8 - 19 - - -  Sodium 135 - 145 mmol/L 136 131(L) 137  Potassium 3.5 - 5.1 mmol/L 3.3(L) 4.4 3.6  Chloride 101 - 111 mmol/L 106 102 109  CO2 22 - 32 mmol/L 20(L) 17(L) 19(L)  Calcium 8.9 - 10.3 mg/dL 8.6(L) 8.5(L) 8.3(L)   Hgb A1c: 14.3  HIV Screen: Non-Reactive  Discharge Instructions: Discharge Instructions    Call MD  for:  difficulty breathing, headache or visual disturbances   Complete by:  As directed    Call MD for:  persistant dizziness or light-headedness   Complete by:  As directed    Call MD for:  persistant nausea and vomiting   Complete by:  As directed    Diet - low sodium heart healthy   Complete by:  As directed    Discharge instructions   Complete by:  As directed    Thank you for allowing us [place] care for you  You symptoms were cause by very elevated blood sugars: - Continue to take Metformin 500m34mice a day - Start Taking Insulin 70/30 40units Twice a day with meals - Please check you blood sugar before meals and before bedtime > You have been given a  paper prescription for meter, test strips, and lancets - Follow up in our clinic for further medication adjustments  For your high blood pressure: - Your Amlodipine 75m Daily was restarted - We have also ordered your Lisinopril to be restarted at home  For your history of high cholesterol: - Your Atorvastatin 414mDaily was restarted  We have also refilled your albuterol inhaler  Please [facility] with [facility] on Monday April 22 @ 3:45pm   Increase activity slowly   Complete by:  As directed       Signed: MeNeva SeatMD 12/01/2017, 12:58 PM   Pager: [phone]

## 2017-12-02 NOTE — Telephone Encounter (Signed)
Called both ph#s for pt, no vmail on 1st, vmail not setup on 2nd- for hosp f/u

## 2017-12-06 ENCOUNTER — Other Ambulatory Visit: Payer: Self-pay

## 2017-12-06 ENCOUNTER — Ambulatory Visit (INDEPENDENT_AMBULATORY_CARE_PROVIDER_SITE_OTHER): Payer: Self-pay | Admitting: Internal Medicine

## 2017-12-06 VITALS — BP 128/74 | HR 80 | Temp 98.2°F | Ht 70.0 in | Wt 321.7 lb

## 2017-12-06 DIAGNOSIS — I10 Essential (primary) hypertension: Secondary | ICD-10-CM

## 2017-12-06 DIAGNOSIS — G4733 Obstructive sleep apnea (adult) (pediatric): Secondary | ICD-10-CM

## 2017-12-06 DIAGNOSIS — Z794 Long term (current) use of insulin: Secondary | ICD-10-CM

## 2017-12-06 DIAGNOSIS — Z8719 Personal history of other diseases of the digestive system: Secondary | ICD-10-CM

## 2017-12-06 DIAGNOSIS — N179 Acute kidney failure, unspecified: Secondary | ICD-10-CM

## 2017-12-06 DIAGNOSIS — E118 Type 2 diabetes mellitus with unspecified complications: Secondary | ICD-10-CM

## 2017-12-06 DIAGNOSIS — Z79899 Other long term (current) drug therapy: Secondary | ICD-10-CM

## 2017-12-06 DIAGNOSIS — E119 Type 2 diabetes mellitus without complications: Secondary | ICD-10-CM

## 2017-12-06 MED ORDER — INSULIN ASPART PROT & ASPART (70-30 MIX) 100 UNIT/ML ~~LOC~~ SUSP: 45.0000 [IU] | Freq: Two times a day (BID) | SUBCUTANEOUS | 0 refills | Status: DC

## 2017-12-06 MED ORDER — METFORMIN HCL 500 MG PO TABS: 1000.0000 mg | ORAL_TABLET | Freq: Two times a day (BID) | ORAL | 1 refills | Status: DC

## 2017-12-06 MED FILL — metFORMIN HCL 500 MG TABS: 500 | 30 days supply | Qty: 120 | Fill #0

## 2017-12-06 NOTE — Progress Notes (Signed)
   CC: HFU DKA  HPI:  [name] is a 42 y.o. M with uncontrolled type 2 diabetes with recent admission for DKA, essential HTN, OSA and history of alcoholic pancreatitis who presents today for hospital follow-up. He was admitted 4/14-4/16 with DKA and discharged on insulin therapy.   For details regarding today's visit and the status of their chronic medical issues, please refer to the assessment and plan.  Past Medical History:  Diagnosis Date  . DKA (diabetic ketoacidoses) (HCC) 11/29/2017  . Essential hypertension 11/23/2007  . Hyperlipidemia 11/23/2007  . Intermittent asthma, well controlled 11/23/2007  . Obstructive sleep apnea 01/03/2008  . Pancreatitis, alcoholic, acute 2001  . Type 2 diabetes mellitus without complication, without long-term current use of insulin (HCC) 09/24/2015   Review of Systems:   General: Denies fevers, chills, weight loss, weakness HEENT: Denies changes in vision, headache, cough Cardiac: Denies CP, SOB Abd: Denies abdominal pain, diarrhea, changes in bowels Extremities: Denies LE swelling or cramping  Physical Exam: General: Alert, in no acute distress. Pleasant and conversant. Seems motivated to get better control of DM HEENT: No icterus, injection or ptosis. No hoarseness or dysarthria  Cardiac: RRR, no MGR Pulmonary: CTA BL with normal WOB on RA. Able to speak in complete sentences Abd: Soft, non-tender. +bs Extremities: Warm, perfused. No significant pedal edema.   Vitals:   12/06/17 1601  BP: 128/74  Pulse: 80  Temp: 98.2 F (36.8 C)  TempSrc: Oral  SpO2: 99%  Weight: (!) 321 lb 11.2 oz (145.9 kg)  Height: 5\' 10"  (1.778 m)   Body mass index is 46.16 kg/m.  Assessment & Plan:   See Encounters Tab for problem based charting.  Patient discussed with Dr. Cleda DaubE. Hoffman

## 2017-12-06 NOTE — Assessment & Plan Note (Addendum)
Assessment: Mr. Rayburn MaBlackmon presents for HFU for DKA. Prior to admission, he was on metformin monotherapy with most recent A1c 6.2% 03/2016. A1c during admission for DKA >14% and he was discharged home with Metformin 1000mg  daily and Insulin 70/30 40 units BID.  Since discharge, his blood sugars remain elevated with AM CBGs ranging 270-350. Evening CBGs 250-446. Wife and patient deny any issues with insulin administration and have been compliant with twice daily dosing. No further abdominal pain, nausea, or vomiting.   Plan: Patient remains uncontrolled with CBG's >250 throughout the day. Will increase to 45 units 70/30 and also increase metformin to 2000 mg total daily. -Encouraged to continue regular CBG checks -Encouraged re: nutrition/diabetic education vs food journal, but patient not interested in either -On statin -RTC 2 weeks for continued close follow-up, consider addition of GLP-1

## 2017-12-06 NOTE — Assessment & Plan Note (Signed)
Assessment & Plan: BP 128/74 with pulse 80. Admits to compliance with daily Amlodipine 10mg  and Lisinopril 20mg . Will continue current regimen

## 2017-12-06 NOTE — Assessment & Plan Note (Signed)
Assessment & Plan: Patient with history of AKI in setting of DKA, most likely related to significant volume depletion. Will get BMET today to ensure resolution and stable electrolytes

## 2017-12-06 NOTE — Patient Instructions (Signed)
It was great meeting you today. I'm glad you are feeling better since discharge.   Your blood sugars are still uncontrolled. Please increase your 70/30 insulin to 45 units twice daily. Please remember to take this just before your meal, instead of after.   In addition, I'm increasing your Metformin dose to 2000mg  daily. I've sent a new prescription to your pharmacy for 1000mg  tablets to make taking them easier.   I'm checking your kidney function today to make sure it's stable since you left the hospital.   Please return to clinic in 2-weeks for continued follow-up!

## 2017-12-07 LAB — BMP8+ANION GAP
ANION GAP: 14 mmol/L (ref 10.0–18.0)
BUN/Creatinine Ratio: 10 (ref 9–20)
BUN: 9 mg/dL (ref 6–24)
CALCIUM: 9 mg/dL (ref 8.7–10.2)
CHLORIDE: 99 mmol/L (ref 96–106)
CO2: 25 mmol/L (ref 20–29)
Creatinine, Ser: 0.9 mg/dL (ref 0.76–1.27)
GFR calc Af Amer: 122 mL/min/{1.73_m2} (ref >59)
GFR, EST NON AFRICAN AMERICAN: 106 mL/min/{1.73_m2} (ref >59)
Glucose: 195 mg/dL — ABNORMAL HIGH (ref 65–99)
POTASSIUM: 4 mmol/L (ref 3.5–5.2)
Sodium: 138 mmol/L (ref 134–144)

## 2017-12-07 NOTE — Progress Notes (Signed)
Internal Medicine Clinic Attending  Case discussed with Dr. Molt at the time of the visit.  We reviewed the resident's history and exam and pertinent patient test results.  I agree with the assessment, diagnosis, and plan of care documented in the resident's note. 

## 2017-12-08 NOTE — Telephone Encounter (Signed)
ATTEMPTED TO CALL PT TO F/U ON HOSP F/U APPT, no answer. Will send to front office for appt 2 weeks from 4/22 appt. Would like to encourage adherence to diab regimen

## 2017-12-08 NOTE — Telephone Encounter (Signed)
[facility]

## 2017-12-09 ENCOUNTER — Telehealth: Payer: Self-pay | Admitting: General Practice

## 2017-12-09 MED FILL — NOVOLOG MIX 70-30 FLEXPEN S: (70-30) 100 | 30 days supply | Qty: 27 | Fill #0

## 2017-12-09 MED FILL — UNIFINE PENTIPS 6MM 31G: 31G X 6 MM | 30 days supply | Qty: 100 | Fill #0

## 2017-12-09 NOTE — Telephone Encounter (Signed)
Thanks Pathmark StoresHelen. Did not send new Rx but did update in chart and AVS as he was just sent 11 refills of a similar one.

## 2017-12-09 NOTE — Telephone Encounter (Signed)
Patient is requesting refill on insulin, [facility]

## 2017-12-09 NOTE — Telephone Encounter (Signed)
Called pharm, they did not get 4/22 script done by dr molt, it was "NO PRINT", gave to them verbally, IM PROGRAM

## 2017-12-20 ENCOUNTER — Ambulatory Visit: Payer: Medicaid Other

## 2017-12-22 ENCOUNTER — Encounter: Payer: Self-pay | Admitting: Internal Medicine

## 2017-12-22 ENCOUNTER — Ambulatory Visit (INDEPENDENT_AMBULATORY_CARE_PROVIDER_SITE_OTHER): Payer: Self-pay | Admitting: Internal Medicine

## 2017-12-22 VITALS — BP 127/76 | HR 99 | Temp 98.3°F | Wt 316.7 lb

## 2017-12-22 DIAGNOSIS — E119 Type 2 diabetes mellitus without complications: Secondary | ICD-10-CM

## 2017-12-22 DIAGNOSIS — Z794 Long term (current) use of insulin: Secondary | ICD-10-CM

## 2017-12-22 DIAGNOSIS — E1110 Type 2 diabetes mellitus with ketoacidosis without coma: Secondary | ICD-10-CM

## 2017-12-22 LAB — GLUCOSE, CAPILLARY: GLUCOSE-CAPILLARY: 84 mg/dL (ref 65–99)

## 2017-12-22 NOTE — Patient Instructions (Signed)
It was good to meet you today Wayne Leonard.  I am glad your blood sugars are somewhat better controlled, we can probably start trying to decrease your insulin.  I do recommend increasing the metformin up to the maximum dose of 1000 mg twice daily.  Usually we can try to transition to more oral medicine and hopefully decrease the amount of injection medicine needed.  Along with this plus the low blood sugars recommend trying to decrease your insulin down to 30 units twice daily.  This might result in the sugar getting high again and you can increase yourself back by 5 units at a time (35 units twice daily).  In general if you are having to skip any doses due to low blood sugar then the current dose has gotten too high.

## 2017-12-22 NOTE — Progress Notes (Signed)
   CC: Diabetes management  HPI:  [name] is a 42 y.o. male with PMHx detailed below presenting for 2 week follow up after increasing his dose of 70/30 insulin to 45 units twice daily. He has been getting some low blood sugars around 70-80 in the morning so has skipped some of his night time injections. He does not feel very sick while checking these measurements. His CBG in clinic today is 84 after no insulin last night but his normal dose this morning. He is currently taking just  of metformin daily although was previously recommended to double this.  See problem based assessment and plan below for additional details.  Type II diabetes mellitus with ketoacidosis, uncontrolled (H[facility]) His blood sugar control is much better today. He is now overtreating his diabetes likely from improved diet and also decreased insulin resistance now that CBGs are normal. He demonstrates a good understanding of diabetes and his treatment plan. Ideally we can reduce his insulin much more in the future but given his DKA and recent a1c > 14% I would prefer to decrease slowly and introduce alternative medicines once he is at a stable regimen. I counseled him about this a while and trying to maximize his treatments besides insulin Plan: Increase metformin to  BID Decrease 70/30 insulin to 35 units before meals. He can titrate himself up or down 5 units based on home measurements still being low or getting back high above 200 We can follow up in 1-2 months   Past Medical History:  Diagnosis Date  . DKA (diabetic ketoacidoses) ([facility]) 11/29/2017  . Essential hypertension 11/23/2007  . Hyperlipidemia 11/23/2007  . Intermittent asthma, well controlled 11/23/2007  . Obstructive sleep apnea 01/03/2008  . Pancreatitis, alcoholic, acute 2001  . Type 2 diabetes mellitus without complication, without long-term current use of insulin ([facility]) 09/24/2015    Review of Systems: Review of Systems  Constitutional:  Negative for chills and fever.  Eyes: Negative for blurred vision.  Cardiovascular: Negative for leg swelling.  Genitourinary: Negative for frequency.  Neurological: Negative for dizziness.     Physical Exam: Vitals:   12/22/17 1548  BP: 127/76  Pulse: 99  Temp: 98.3 F (36.8 C)  TempSrc: Oral  SpO2: 99%  Weight: (!) 316 lb 11.2 oz (143.7 kg)   GENERAL- alert, co-operative, NAD HEENT- Atraumatic, PERRL, oral mucosa appears moist CARDIAC- RRR, no murmurs, rubs or gallops. RESP- CTAB, no wheezes or crackles. EXTREMITIES- Symmetric, no pedal edema. SKIN- Warm, dry, No rash or lesion. PSYCH- Normal mood and affect, appropriate thought content and speech.   Assessment & Plan:   See encounters tab for problem based medical decision making.   Patient discussed with Dr. Cyndie Chime

## 2017-12-24 NOTE — Assessment & Plan Note (Signed)
His blood sugar control is much better today. He is now overtreating his diabetes likely from improved diet and also decreased insulin resistance now that CBGs are normal. He demonstrates a good understanding of diabetes and his treatment plan. Ideally we can reduce his insulin much more in the future but given his DKA and recent a1c > 14% I would prefer to decrease slowly and introduce alternative medicines once he is at a stable regimen. I counseled him about this a while and trying to maximize his treatments besides insulin Plan: Increase metformin to  BID Decrease 70/30 insulin to 35 units before meals. He can titrate himself up or down 5 units based on home measurements still being low or getting back high above 200 We can follow up in 1-2 months

## 2017-12-24 NOTE — Progress Notes (Signed)
Medicine attending: Medical history, presenting problems, physical findings, and medications, reviewed with resident physician Dr Christopher Rice on the day of the patient visit and I concur with his evaluation and management plan. 

## 2018-02-02 ENCOUNTER — Ambulatory Visit: Payer: Medicaid Other

## 2018-02-02 ENCOUNTER — Encounter: Payer: Self-pay | Admitting: Internal Medicine

## 2018-02-25 ENCOUNTER — Encounter: Payer: Self-pay | Admitting: Internal Medicine

## 2018-02-25 ENCOUNTER — Encounter (INDEPENDENT_AMBULATORY_CARE_PROVIDER_SITE_OTHER): Payer: Self-pay

## 2018-02-25 ENCOUNTER — Ambulatory Visit (INDEPENDENT_AMBULATORY_CARE_PROVIDER_SITE_OTHER): Payer: Self-pay | Admitting: Internal Medicine

## 2018-02-25 VITALS — BP 163/95 | HR 80 | Temp 99.1°F | Ht 70.0 in | Wt 322.2 lb

## 2018-02-25 DIAGNOSIS — I10 Essential (primary) hypertension: Secondary | ICD-10-CM

## 2018-02-25 DIAGNOSIS — G4733 Obstructive sleep apnea (adult) (pediatric): Secondary | ICD-10-CM

## 2018-02-25 DIAGNOSIS — E785 Hyperlipidemia, unspecified: Secondary | ICD-10-CM

## 2018-02-25 DIAGNOSIS — E1110 Type 2 diabetes mellitus with ketoacidosis without coma: Secondary | ICD-10-CM

## 2018-02-25 LAB — GLUCOSE, CAPILLARY: Glucose-Capillary: 70 mg/dL (ref 70–99)

## 2018-02-25 LAB — POCT GLYCOSYLATED HEMOGLOBIN (HGB A1C): Hemoglobin A1C: 7.2 % — AB (ref 4.0–5.6)

## 2018-02-25 MED ORDER — METFORMIN HCL 500 MG PO TABS: 1000.0000 mg | ORAL_TABLET | Freq: Two times a day (BID) | ORAL | 1 refills | Status: DC

## 2018-02-25 MED ORDER — INSULIN ASPART PROT & ASPART (70-30 MIX) 100 UNIT/ML ~~LOC~~ SUSP: 20.0000 [IU] | Freq: Two times a day (BID) | SUBCUTANEOUS | 2 refills | Status: DC

## 2018-02-25 MED ORDER — ATORVASTATIN CALCIUM 40 MG PO TABS: 40.0000 mg | ORAL_TABLET | Freq: Every day | ORAL | 3 refills | Status: DC

## 2018-02-25 MED ORDER — LISINOPRIL 20 MG PO TABS: 20.0000 mg | ORAL_TABLET | Freq: Every day | ORAL | 3 refills | Status: DC

## 2018-02-25 MED ORDER — AMLODIPINE BESYLATE 10 MG PO TABS: 10.0000 mg | ORAL_TABLET | Freq: Every day | ORAL | 3 refills | Status: DC

## 2018-02-25 NOTE — Patient Instructions (Addendum)
[name],   Your A1c is 7.2 today,. Keep up the good work!   For your insulin I would like you to decrease the units to 20 two times a day. You can add an additional 5 units if your blood sugar is high before injecting the insulin.   I refilled all your medications and [facility]. These include amlodipine, lisinopril, atorvastatin, metformin, and novolog.  Please let me know if you have any issues getting these medications.  Please come back in 3 months for a diabetes follow up.  Please call us if you have any questions or concerns.  - Dr. Evelene CroonSantos

## 2018-02-25 NOTE — Progress Notes (Signed)
Performed diabetes foot exam with the assistance of  Dr. Lovenia KimSantos-Sanchez. Educated patient on smoking and diabetes and foot care. He verbalized good understanding.  Norm Parcelonna Darick Fetters, RD 02/25/2018 4:44 PM.

## 2018-02-28 ENCOUNTER — Other Ambulatory Visit: Payer: Self-pay | Admitting: Internal Medicine

## 2018-03-01 ENCOUNTER — Encounter: Payer: Self-pay | Admitting: Internal Medicine

## 2018-03-01 NOTE — Assessment & Plan Note (Addendum)
OSA: Patient denies apneic episodes at night, daytime sleepiness, morning headaches, and fatigue.  He is not compliant with CPAP at home because he feels the mask is too tight.  Had a lengthy discussion with patient regarding long term complications of OSA. Also discussed weight loss  as patient is up 6lbs though expected since he was started on insulin 3 months ago.

## 2018-03-01 NOTE — Progress Notes (Signed)
   CC: T2DM and HTN follow up   HPI:  [name] is a 42 y.o. male with PMH listed below who presents to clinic for follow-up of type 2 diabetes, hypertension, OSA, and hyperlipidemia.  T2DM, controlled: Recently admitted in April for DKA due to medication noncompliance.  Now on NovoLog 70/30 and maximum dose of metformin.  Reports compliance medications.  He was seen in clinic in 5/10 at which time his NovoLog dose was reduced from 45 units twice daily to 30 units twice daily.  He brings his BG meter today and several episodes of hypoglycemia noted.  He states that he usually feels diaphoretic and dizzy during these episodes.  A1c 14.3 4/14 --> 7.3 today.  Will decrease NovoLog 70/30 dose to 25 units twice daily and continue to titrate down if blood glucose remains controlled.  I am hopeful we can transition patient to home medications.  We will continue metformin at maximum dose as well.  Foot exam performed today.  Unable to refer her eye exam as patient is uninsured.  [facility] Can get eye exam here if able to get it. Refilled all meds. Follow up in 3 months.   Essential HTN, uncontrolled: Patient takes lisinopril 20 mg daily and amlodipine 10 mg daily at home.  He ran out of his medications 3 days ago.  Blood pressure today is 163/95.  Refill both amlodipine and lisinopril at stated doses as he was well controlled on this. Follow up in 3 months.   OSA: Patient denies apneic episodes at night, daytime sleepiness, morning headaches, and fatigue.  He is not compliant with CPAP at home because he feels the mask is too tight.  Had a lengthy discussion with patient regarding long term complications of OSA. Also discussed weight loss as patient is up 6lbs though expected since he was started on insulin 3 months ago.   HLD: Refilled atorvastatin 40 mg QD.   Past Medical History:  Diagnosis Date  . DKA (diabetic ketoacidoses) (HCC) 11/29/2017    . Essential hypertension 11/23/2007  . Hyperlipidemia 11/23/2007  . Intermittent asthma, well controlled 11/23/2007  . Obstructive sleep apnea 01/03/2008  . Pancreatitis, alcoholic, acute 2001  . Type 2 diabetes mellitus without complication, without long-term current use of insulin (HCC) 09/24/2015   Review of Systems:   Review of Systems  Constitutional: Negative for chills, fever and malaise/fatigue.  Respiratory: Negative for cough and shortness of breath.   Cardiovascular: Negative for chest pain, palpitations and leg swelling.  Genitourinary: Negative for dysuria, frequency and urgency.  Neurological: Negative for dizziness and headaches.    Physical Exam:  Vitals:   02/25/18 1531  BP: (!) 163/95  Pulse: 80  Temp: 99.1 F (37.3 C)  TempSrc: Oral  SpO2: 99%  Weight: (!) 322 lb 3.2 oz (146.1 kg)  Height: 5\' 10"  (1.778 m)   Physical Exam  Constitutional: He is oriented to person, place, and time and well-developed, well-nourished, and in no distress.  Cardiovascular: Normal rate, regular rhythm and normal heart sounds. Exam reveals no gallop and no friction rub.  No murmur heard. Pulmonary/Chest: Effort normal and breath sounds normal. No respiratory distress. He has no wheezes. He has no rales.  Musculoskeletal: He exhibits no edema.  Neurological: He is alert and oriented to person, place, and time.    Assessment & Plan:   See Encounters Tab for problem based charting.  Patient discussed with Dr. Heide SparkNarendra

## 2018-03-01 NOTE — Assessment & Plan Note (Signed)
Essential HTN, uncontrolled: Patient takes lisinopril 20 mg daily and amlodipine 10 mg daily at home.  He ran out of his medications 3 days ago.  Blood pressure today is 163/95.  Refill both amlodipine and lisinopril at stated doses as he was well controlled on this. Follow up in 3 months.

## 2018-03-01 NOTE — Assessment & Plan Note (Addendum)
HLD: Refilled atorvastatin 40 mg QD. Lipid panel at next visit as previous one in 2017 while he was on pravastatin.

## 2018-03-01 NOTE — Assessment & Plan Note (Addendum)
T2DM, controlled: Recently admitted in April for DKA due to medication noncompliance.  Now on NovoLog 70/30 and maximum dose of metformin.  Reports compliance medications.  He was seen in clinic in 5/10 at which time his NovoLog dose was reduced from 45 units twice daily to 30 units twice daily.  He brings his BG meter today and several episodes of hypoglycemia noted.  He states that he usually feels diaphoretic and dizzy during these episodes.  A1c 14.3 4/14 --> 7.3 today.  Will decrease NovoLog 70/30 dose to 25 units twice daily and continue to titrate down if blood glucose remains controlled.  I am hopeful we can transition patient to home medications.  We will continue metformin at maximum dose as well. Foot exam performed today.  Unable to refer her eye exam as patient is uninsured.  [facility] Can get eye exam here if able to get it.

## 2018-03-02 ENCOUNTER — Other Ambulatory Visit: Payer: Self-pay | Admitting: Internal Medicine

## 2018-03-02 ENCOUNTER — Telehealth: Payer: Self-pay

## 2018-03-02 NOTE — Telephone Encounter (Signed)
Med list updated. Thanks.  

## 2018-03-02 NOTE — Progress Notes (Signed)
Internal Medicine Clinic Attending  Case discussed with Dr. Santos-Sanchez at the time of the visit.  We reviewed the resident's history and exam and pertinent patient test results.  I agree with the assessment, diagnosis, and plan of care documented in the resident's note.    

## 2018-03-02 NOTE — Telephone Encounter (Signed)
Novolog 70/30 vial sent 02/25/2018 was under IM program. Confirmed with Helmut MusterAlicia that Novolog 70/30 flexpen sent today should be under IM Program as well. Will route to PCP to remove vial from med list. Kinnie FeilL. Alexzandra Bilton, RN, BSN

## 2018-03-02 NOTE — Telephone Encounter (Signed)
Wayne Leonard with outpatient pharmacy, needs to speak with a nurse about med. Please call back.  

## 2018-03-03 ENCOUNTER — Other Ambulatory Visit: Payer: Self-pay | Admitting: *Deleted

## 2018-03-03 MED ORDER — INSULIN ASPART PROT & ASPART (70-30 MIX) 100 UNIT/ML PEN: 25.0000 [IU] | PEN_INJECTOR | Freq: Two times a day (BID) | SUBCUTANEOUS | 1 refills | Status: DC

## 2018-03-03 NOTE — Telephone Encounter (Signed)
Please add "IM Program" to rx if appropriate and re-send . Thanks

## 2018-03-11 ENCOUNTER — Telehealth: Payer: Self-pay | Admitting: Internal Medicine

## 2018-04-07 ENCOUNTER — Ambulatory Visit: Payer: Medicaid Other

## 2018-05-27 ENCOUNTER — Encounter: Payer: Medicaid Other | Admitting: Internal Medicine

## 2018-07-08 ENCOUNTER — Encounter: Payer: Self-pay | Admitting: Internal Medicine

## 2018-07-08 ENCOUNTER — Encounter: Payer: Medicaid Other | Admitting: Internal Medicine

## 2018-07-22 ENCOUNTER — Emergency Department (HOSPITAL_COMMUNITY)
Admission: EM | Admit: 2018-07-22 | Discharge: 2018-07-23 | Disposition: A | Discharge status: Home / Self Care | Payer: Medicaid Other | Attending: Emergency Medicine | Admitting: Emergency Medicine

## 2018-07-22 ENCOUNTER — Encounter (HOSPITAL_COMMUNITY): Payer: Self-pay | Admitting: Emergency Medicine

## 2018-07-22 ENCOUNTER — Other Ambulatory Visit: Payer: Self-pay

## 2018-07-22 DIAGNOSIS — E1110 Type 2 diabetes mellitus with ketoacidosis without coma: Secondary | ICD-10-CM

## 2018-07-22 DIAGNOSIS — Z794 Long term (current) use of insulin: Secondary | ICD-10-CM | POA: Insufficient documentation

## 2018-07-22 DIAGNOSIS — J45909 Unspecified asthma, uncomplicated: Secondary | ICD-10-CM | POA: Insufficient documentation

## 2018-07-22 DIAGNOSIS — Z79899 Other long term (current) drug therapy: Secondary | ICD-10-CM | POA: Insufficient documentation

## 2018-07-22 DIAGNOSIS — R739 Hyperglycemia, unspecified: Secondary | ICD-10-CM

## 2018-07-22 DIAGNOSIS — F17210 Nicotine dependence, cigarettes, uncomplicated: Secondary | ICD-10-CM | POA: Insufficient documentation

## 2018-07-22 DIAGNOSIS — E1165 Type 2 diabetes mellitus with hyperglycemia: Secondary | ICD-10-CM | POA: Insufficient documentation

## 2018-07-22 DIAGNOSIS — I10 Essential (primary) hypertension: Secondary | ICD-10-CM | POA: Insufficient documentation

## 2018-07-22 LAB — CBG MONITORING, ED

## 2018-07-22 LAB — CBC
HCT: 46.1 % (ref 39.0–52.0)
Hemoglobin: 13.9 g/dL (ref 13.0–17.0)
MCH: 24.5 pg — ABNORMAL LOW (ref 26.0–34.0)
MCHC: 30.2 g/dL (ref 30.0–36.0)
MCV: 81.3 fL (ref 80.0–100.0)
Platelets: 202 10*3/uL (ref 150–400)
RBC: 5.67 MIL/uL (ref 4.22–5.81)
RDW: 15.9 % — ABNORMAL HIGH (ref 11.5–15.5)
WBC: 8.9 10*3/uL (ref 4.0–10.5)
nRBC: 0 % (ref 0.0–0.2)

## 2018-07-22 LAB — URINALYSIS, ROUTINE W REFLEX MICROSCOPIC
Bacteria, UA: NONE SEEN
Bilirubin Urine: NEGATIVE
Glucose, UA: >=500 mg/dL — AB
Hgb urine dipstick: NEGATIVE
Ketones, ur: 5 mg/dL — AB
Leukocytes, UA: NEGATIVE
Nitrite: NEGATIVE
Protein, ur: NEGATIVE mg/dL
Specific Gravity, Urine: 1.031 — ABNORMAL HIGH (ref 1.005–1.030)
pH: 5 (ref 5.0–8.0)

## 2018-07-22 LAB — BASIC METABOLIC PANEL
Anion gap: 12 (ref 5–15)
BUN: 13 mg/dL (ref 6–20)
CO2: 23 mmol/L (ref 22–32)
Calcium: 8.9 mg/dL (ref 8.9–10.3)
Chloride: 95 mmol/L — ABNORMAL LOW (ref 98–111)
Creatinine, Ser: 1.3 mg/dL — ABNORMAL HIGH (ref 0.61–1.24)
GFR calc Af Amer: >60 mL/min (ref >60)
GFR calc non Af Amer: >60 mL/min (ref >60)
Glucose, Bld: 686 mg/dL (ref 70–99)
Potassium: 4 mmol/L (ref 3.5–5.1)
Sodium: 130 mmol/L — ABNORMAL LOW (ref 135–145)

## 2018-07-22 MED ORDER — LACTATED RINGERS IV BOLUS
2000.0000 mL | Freq: Once | INTRAVENOUS | Status: AC
  Administered 2018-07-22: 1000 mL via INTRAVENOUS

## 2018-07-22 MED ORDER — INSULIN ASPART PROT & ASPART (70-30 MIX) 100 UNIT/ML PEN: 25.0000 [IU] | PEN_INJECTOR | Freq: Two times a day (BID) | SUBCUTANEOUS | 1 refills | Status: DC

## 2018-07-22 MED ORDER — METFORMIN HCL 500 MG PO TABS: 1000.0000 mg | ORAL_TABLET | Freq: Two times a day (BID) | ORAL | 1 refills | Status: DC

## 2018-07-22 MED ORDER — INSULIN ASPART 100 UNIT/ML ~~LOC~~ SOLN
12.0000 [IU] | Freq: Once | SUBCUTANEOUS | Status: AC
  Administered 2018-07-22: 12 [IU] via INTRAVENOUS

## 2018-07-22 NOTE — ED Triage Notes (Signed)
Pt reports checking his CBG yesterday and the meter read HI. Pt's CBG reads HI today. Pt denies abdominal pain, N/V. Pt reports dry mouth and urinary frequency.

## 2018-07-22 NOTE — ED Notes (Signed)
Dr. Juleen ChinaKohut aware Glucose 686. No new orders. Will work on room placement.

## 2018-07-22 NOTE — ED Provider Notes (Signed)
[facility] EMERGENCY DEPARTMENT Provider Note   CSN: [id number] Arrival date & time: 07/22/18  2034     History   Chief Complaint Chief Complaint  Patient presents with  . Hyperglycemia    HPI Wayne Leonard is a 42 y.o. male.  HPI   42 year old male with hyperglycemia.  Reports increasing polyuria and polydipsia since this past Tuesday.  Not of his diabetic vomiting.  Denies any acute pain.  No fatigue.  Denies weight loss or visual changes.  No fevers or chills.  He was previously on 70/30 insulin and metformin.  Past Medical History:  Diagnosis Date  . DKA (diabetic ketoacidoses) ([place]) 11/29/2017  . Essential hypertension 11/23/2007  . Hyperlipidemia 11/23/2007  . Intermittent asthma, well controlled 11/23/2007  . Obstructive sleep apnea 01/03/2008  . Pancreatitis, alcoholic, acute 3612  . Type 2 diabetes mellitus without complication, without long-term current use of insulin ([place]) 09/24/2015    Patient Active Problem List   Diagnosis Date Noted  . 'light-for-dates' infant with signs of fetal malnutrition 02/02/2018  . Morbid obesity ([place]) 11/30/2017  . Healthcare maintenance 03/25/2016  . Type II diabetes mellitus with ketoacidosis, uncontrolled ([place]) 09/24/2015  . Tobacco use disorder 09/24/2015  . Obstructive sleep apnea 01/03/2008  . Hyperlipidemia 11/23/2007  . Essential hypertension 11/23/2007  . Intermittent asthma, well controlled 11/23/2007    Past Surgical History:  Procedure Laterality Date  . NO PAST SURGERIES          Home Medications    Prior to Admission medications   Medication Sig Start Date End Date Taking? Authorizing Provider  albuterol (PROVENTIL HFA;VENTOLIN HFA) 108 (90 Base) MCG/ACT inhaler Inhale 2 puffs into the lungs every 6 (six) hours as needed for wheezing or shortness of breath. 11/30/17  Yes Neva Seat, MD  albuterol (PROVENTIL) (2.5 MG/3ML) 0.083% nebulizer solution Take 3 mLs (2.5 mg total) by  nebulization every 6 (six) hours as needed for wheezing or shortness of breath. Patient not taking: Reported on 07/22/2018 12/24/15   Shela Leff, MD  amLODipine (NORVASC) 10 MG tablet Take 1 tablet (10 mg total) by mouth daily. 02/25/18 02/25/19  Santos-Sanchez, Merlene Morse, MD  atorvastatin (LIPITOR) 40 MG tablet Take 1 tablet (40 mg total) by mouth daily. Patient not taking: Reported on 07/22/2018 02/25/18 02/25/19  Welford Roche, MD  blood glucose meter kit and supplies KIT Dispense based on patient and insurance preference. Use up to four times daily as directed. (FOR ICD-9 250.00, 250.01). 11/30/17   Neva Seat, MD  insulin aspart protamine - aspart (NOVOLOG MIX 70/30 FLEXPEN) (70-30) 100 UNIT/ML FlexPen Inject 0.25 mLs (25 Units total) into the skin 2 (two) times daily with a meal. 03/03/18   Welford Roche, MD  lisinopril (PRINIVIL,ZESTRIL) 20 MG tablet Take 1 tablet (20 mg total) by mouth daily. 02/25/18 02/25/19  Welford Roche, MD  metFORMIN (GLUCOPHAGE) 500 MG tablet Take 2 tablets (1,000 mg total) by mouth 2 (two) times daily. 02/25/18   Welford Roche, MD    Family History Family History  Problem Relation Age of Onset  . Asthma Father   . CAD Father   . Diabetes Mellitus II Mother   . Hypertension Mother   . Asthma Brother     Social History Social History   Tobacco Use  . Smoking status: Current Every Day Smoker    Packs/day: 1.00    Years: 24.00    Pack years: 24.00    Types: Cigarettes  . Smokeless tobacco: Never Used  .  Tobacco comment: smoking 1 PPD  Substance Use Topics  . Alcohol use: Not Currently    Alcohol/week: 0.0 standard drinks  . Drug use: No     Allergies   Iodine; Oysters [shellfish allergy]; Shellfish-derived products; and Tomato   Review of Systems Review of Systems  All systems reviewed and negative, other than as noted in HPI.  Physical Exam Updated Vital Signs BP (!) 139/93 (BP Location: Right Arm)    Pulse (!) 101   Temp 98.6 F (37 C) (Oral)   Resp 18   SpO2 97%   Physical Exam  Constitutional: He appears well-developed and well-nourished. No distress.  HENT:  Head: Normocephalic and atraumatic.  Eyes: Conjunctivae are normal. Right eye exhibits no discharge. Left eye exhibits no discharge.  Neck: Neck supple.  Cardiovascular: Normal rate, regular rhythm and normal heart sounds. Exam reveals no gallop and no friction rub.  No murmur heard. Pulmonary/Chest: Effort normal and breath sounds normal. No respiratory distress.  Abdominal: Soft. He exhibits no distension. There is no tenderness.  Musculoskeletal: He exhibits no edema or tenderness.  Neurological: He is alert.  Skin: Skin is warm and dry.  Psychiatric: He has a normal mood and affect. His behavior is normal. Thought content normal.  Nursing note and vitals reviewed.    ED Treatments / Results  Labs (all labs ordered are listed, but only abnormal results are displayed) Labs Reviewed  BASIC METABOLIC PANEL - Abnormal; Notable for the following components:      Result Value   Sodium 130 (*)    Chloride 95 (*)    Glucose, Bld 686 (*)    Creatinine, Ser 1.30 (*)    All other components within normal limits  CBC - Abnormal; Notable for the following components:   MCH 24.5 (*)    RDW 15.9 (*)    All other components within normal limits  URINALYSIS, ROUTINE W REFLEX MICROSCOPIC - Abnormal; Notable for the following components:   Color, Urine COLORLESS (*)    Specific Gravity, Urine 1.031 (*)    Glucose, UA >=500 (*)    Ketones, ur 5 (*)    All other components within normal limits  CBG MONITORING, ED - Abnormal; Notable for the following components:   Glucose-Capillary >600 (*)    All other components within normal limits  CBG MONITORING, ED    EKG None  Radiology No results found.  Procedures Procedures (including critical care time)  Medications Ordered in ED Medications  lactated ringers bolus  2,000 mL (has no administration in time range)  insulin aspart (novoLOG) injection 12 Units (has no administration in time range)     Initial Impression / Assessment and Plan / ED Course  I have reviewed the triage vital signs and the nursing notes.  Pertinent labs & imaging results that were available during my care of the patient were reviewed by me and considered in my medical decision making (see chart for details).     42 year old male with symptomatic hyperglycemia secondary to compliance with his medications.  Pharmacy made them aware that he actually has refills available which patient was unaware of.  He is not in DKA.  We will give IV fluids and some insulin.  Advised to follow-up with internal medicine.  Advised to restart metformin 1000 mg twice a day and 25 units of 70/30 BID.  Return precautions discussed.  Final Clinical Impressions(s) / ED Diagnoses   Final diagnoses:  Hyperglycemia    ED Discharge Orders  None       Virgel Manifold, MD 07/23/18 2123

## 2018-07-22 NOTE — Discharge Instructions (Addendum)
Please monitor your blood sugars at home. If they are staying high, you may need to increase your insulin dose.

## 2018-07-22 NOTE — ED Notes (Signed)
No meds for 3-4 months  When he lost his job and could not get his meds  For the past several days he has been urinating more than usual  And more often.  Mouth dry also

## 2018-07-22 NOTE — ED Notes (Signed)
Checked CBG meter reads Hi, RN Baird Lyonsasey informed

## 2018-07-23 LAB — CBG MONITORING, ED: Glucose-Capillary: 338 mg/dL — ABNORMAL HIGH (ref 70–99)

## 2018-07-23 NOTE — ED Notes (Signed)
Pt has been sleeping 

## 2018-07-23 NOTE — ED Provider Notes (Signed)
Care assumed from Dr. Juleen ChinaKohut, patient presenting with hyperglycemia without evidence of ketoacidosis.  With IV fluids, subcu insulin, glucose is down to 338.  He is felt to be safe for discharge.  Results for orders placed or performed during the hospital encounter of 07/22/18  Basic metabolic panel  Result Value Ref Range   Sodium 130 (L) 135 - 145 mmol/L   Potassium 4.0 3.5 - 5.1 mmol/L   Chloride 95 (L) 98 - 111 mmol/L   CO2 23 22 - 32 mmol/L   Glucose, Bld 686 (HH) 70 - 99 mg/dL   BUN 13 6 - 20 mg/dL   Creatinine, Ser 1.611.30 (H) 0.61 - 1.24 mg/dL   Calcium 8.9 8.9 - 09.610.3 mg/dL   GFR calc non Af Amer >60 >60 mL/min   GFR calc Af Amer >60 >60 mL/min   Anion gap 12 5 - 15  CBC  Result Value Ref Range   WBC 8.9 4.0 - 10.5 K/uL   RBC 5.67 4.22 - 5.81 MIL/uL   Hemoglobin 13.9 13.0 - 17.0 g/dL   HCT 04.546.1 40.939.0 - 81.152.0 %   MCV 81.3 80.0 - 100.0 fL   MCH 24.5 (L) 26.0 - 34.0 pg   MCHC 30.2 30.0 - 36.0 g/dL   RDW 91.415.9 (H) 78.211.5 - 95.615.5 %   Platelets 202 150 - 400 K/uL   nRBC 0.0 0.0 - 0.2 %  Urinalysis, Routine w reflex microscopic  Result Value Ref Range   Color, Urine COLORLESS (A) YELLOW   APPearance CLEAR CLEAR   Specific Gravity, Urine 1.031 (H) 1.005 - 1.030   pH 5.0 5.0 - 8.0   Glucose, UA >=500 (A) NEGATIVE mg/dL   Hgb urine dipstick NEGATIVE NEGATIVE   Bilirubin Urine NEGATIVE NEGATIVE   Ketones, ur 5 (A) NEGATIVE mg/dL   Protein, ur NEGATIVE NEGATIVE mg/dL   Nitrite NEGATIVE NEGATIVE   Leukocytes, UA NEGATIVE NEGATIVE   RBC / HPF 0-5 0 - 5 RBC/hpf   WBC, UA 0-5 0 - 5 WBC/hpf   Bacteria, UA NONE SEEN NONE SEEN   Squamous Epithelial / LPF 0-5 0 - 5   Mucus PRESENT   CBG monitoring, ED  Result Value Ref Range   Glucose-Capillary >600 (HH) 70 - 99 mg/dL   Comment 1 Notify RN   CBG monitoring, ED  Result Value Ref Range   Glucose-Capillary 338 (H) 70 - 99 mg/dL   No results found.    Dione BoozeGlick, Adger Cantera, MD 07/23/18 [phone]

## 2018-10-11 ENCOUNTER — Other Ambulatory Visit: Payer: Self-pay

## 2018-10-11 ENCOUNTER — Ambulatory Visit (INDEPENDENT_AMBULATORY_CARE_PROVIDER_SITE_OTHER): Payer: Self-pay | Admitting: Internal Medicine

## 2018-10-11 VITALS — BP 115/72 | HR 94 | Temp 98.3°F | Ht 70.0 in | Wt 266.2 lb

## 2018-10-11 DIAGNOSIS — L0231 Cutaneous abscess of buttock: Secondary | ICD-10-CM

## 2018-10-11 DIAGNOSIS — E118 Type 2 diabetes mellitus with unspecified complications: Secondary | ICD-10-CM

## 2018-10-11 DIAGNOSIS — I10 Essential (primary) hypertension: Secondary | ICD-10-CM

## 2018-10-11 DIAGNOSIS — Z91120 Patient's intentional underdosing of medication regimen due to financial hardship: Secondary | ICD-10-CM

## 2018-10-11 DIAGNOSIS — Z6838 Body mass index (BMI) 38.0-38.9, adult: Secondary | ICD-10-CM

## 2018-10-11 DIAGNOSIS — Z794 Long term (current) use of insulin: Secondary | ICD-10-CM

## 2018-10-11 DIAGNOSIS — R634 Abnormal weight loss: Secondary | ICD-10-CM | POA: Insufficient documentation

## 2018-10-11 DIAGNOSIS — Z79899 Other long term (current) drug therapy: Secondary | ICD-10-CM

## 2018-10-11 DIAGNOSIS — E1165 Type 2 diabetes mellitus with hyperglycemia: Secondary | ICD-10-CM

## 2018-10-11 LAB — GLUCOSE, CAPILLARY: GLUCOSE-CAPILLARY: 556 mg/dL — AB (ref 70–99)

## 2018-10-11 LAB — POCT GLYCOSYLATED HEMOGLOBIN (HGB A1C): HbA1c POC (<> result, manual entry): >14 % — AB (ref 4.0–5.6)

## 2018-10-11 MED ORDER — INSULIN ASPART 100 UNIT/ML FLEXPEN: 10.0000 [IU] | PEN_INJECTOR | Freq: Three times a day (TID) | SUBCUTANEOUS | 3 refills | Status: DC

## 2018-10-11 MED ORDER — GLUCOSE BLOOD VI STRP: ORAL_STRIP | 12 refills | Status: DC

## 2018-10-11 MED ORDER — INSULIN GLARGINE 100 UNIT/ML SOLOSTAR PEN: 30.0000 [IU] | PEN_INJECTOR | Freq: Every day | SUBCUTANEOUS | 3 refills | Status: DC

## 2018-10-11 MED FILL — LANTUS SOLOSTAR 100 UNITS/M: 100 | 30 days supply | Qty: 9 | Fill #0 | Status: TO

## 2018-10-11 MED FILL — NOVOLOG FLEXPEN SYRINGE: 100 | 30 days supply | Qty: 9 | Fill #0 | Status: TO

## 2018-10-11 MED FILL — CONTOUR NEXT STRIPS: 30 days supply | Qty: 100 | Fill #0

## 2018-10-11 MED FILL — UNIFINE PENTIPS 6MM 31G: 31G X 6 MM | 30 days supply | Qty: 100 | Fill #1 | Status: TO

## 2018-10-11 NOTE — Patient Instructions (Addendum)
[name],   [place] taking the insulin 70/30 and Humalog.   START taking Lantus 30 units and Novolog 10 units three times a day with meals.   Make a follow up appointment with me in 4 weeks to check on your diabetes.   Call us if you have any questions or concerns.   -Dr. Evelene Croon

## 2018-10-12 ENCOUNTER — Encounter: Payer: Self-pay | Admitting: Internal Medicine

## 2018-10-12 LAB — BMP8+ANION GAP
ANION GAP: 22 mmol/L — AB (ref 10.0–18.0)
BUN/Creatinine Ratio: 11 (ref 9–20)
BUN: 13 mg/dL (ref 6–24)
CALCIUM: 10.3 mg/dL — AB (ref 8.7–10.2)
CO2: 20 mmol/L (ref 20–29)
Chloride: 93 mmol/L — ABNORMAL LOW (ref 96–106)
Creatinine, Ser: 1.22 mg/dL (ref 0.76–1.27)
GFR, EST AFRICAN AMERICAN: 85 mL/min/{1.73_m2} (ref >59)
GFR, EST NON AFRICAN AMERICAN: 73 mL/min/{1.73_m2} (ref >59)
Glucose: 524 mg/dL (ref 65–99)
POTASSIUM: 4.5 mmol/L (ref 3.5–5.2)
SODIUM: 135 mmol/L (ref 134–144)

## 2018-10-12 LAB — TSH: TSH: 1.59 u[IU]/mL (ref 0.450–4.500)

## 2018-10-12 NOTE — Assessment & Plan Note (Signed)
Noted on review of chart the patient has lost 56 pounds since 02/2018. We did not discuss this thoroughly during visit as we focused on diabetes management, but suspect this is secondary to uncontrolled diabetes.  Ordered TSH.  Will discuss at follow-up visit.

## 2018-10-12 NOTE — Progress Notes (Signed)
   CC: Hospital follow up for DKA and R buttocks abscess   HPI:  [name] is a 43 y.o. year-old male with PMH listed below who presents to clinic for hospital follow up. Please see problem based assessment and plan for further details.   Past Medical History:  Diagnosis Date  . DKA (diabetic ketoacidoses) ([facility]) 11/29/2017  . Essential hypertension 11/23/2007  . Hyperlipidemia 11/23/2007  . Intermittent asthma, well controlled 11/23/2007  . Obstructive sleep apnea 01/03/2008  . Pancreatitis, alcoholic, acute 2001  . Type 2 diabetes mellitus without complication, without long-term current use of insulin ([facility]) 09/24/2015   Review of Systems:   Review of Systems  Constitutional: Negative for chills, fever and malaise/fatigue.  Respiratory: Negative for shortness of breath.   Cardiovascular: Negative for chest pain.  Genitourinary: Negative for dysuria, frequency and urgency.  Endo/Heme/Allergies: Positive for polydipsia.    Physical Exam: Vitals:   10/11/18 0954  BP: 115/72  Pulse: 94  Temp: 98.3 F (36.8 C)  TempSrc: Oral  SpO2: 99%  Weight: 266 lb 3.2 oz (120.7 kg)  Height: 5\' 10"  (1.778 m)   General: well-appearing male in NAD  HENT:dry mucus membranes  Cardiac: regular rate and rhythm, nl S1/S2, no murmurs, rubs or gallops  Pulm: CTAB, no wheezes or crackles, no increased work of breathing on room air  Abd: soft, NTND, bowel sounds are normoactive  Ext: warm and well perfused, no peripheral edema   Assessment & Plan:   See Encounters Tab for problem based charting.  Patient discussed with Dr. Oswaldo Done

## 2018-10-12 NOTE — Assessment & Plan Note (Signed)
Well-controlled on amlodipine 10 mg daily and lisinopril 20 mg daily.  We will continue current regimen.

## 2018-10-12 NOTE — Assessment & Plan Note (Addendum)
[name] presents for T2DM follow up.  He is on Novolog mix 70/30 25 units BID, which we had been titrating down due to improvement in BG and A1c. He was recently admitted to the hospital in [facility] 1/21-1/26 for DKA requiring admission to the ICU in the setting of L buttocks abscess that was I&D by general surgery.  He was discharged on clindamycin 300 mg TID x 10 days, which he has not finished as he has been taking it BID.  Since discharge he has been taking the NovoLog Mix 70/30 5 units TID as well as Humalog 4 units TID with meals, but reports skipping doses. States he takes care of his 3 children and he has difficulty managing his time in insulin doses.  He also has difficulty affording insulin at times. CBG today is in the 500s.  He is asymptomatic.  Uncontrolled type 2 diabetes secondary to medication nonadherence due to financial difficulties.  Will stop NovoLog Mix 70/30 start Lantus 30 units nightly and NovoLog 10 units 3 times daily with meals.  Continue metformin at maximum dose.  BMP ordered to check for acidosis and anion gap.  Will follow up with Lupita Leash in 2 weeks and with me in 4 weeks.

## 2018-10-13 NOTE — Progress Notes (Signed)
Internal Medicine Clinic Attending  Case discussed with Dr. Santos-Sanchez at the time of the visit.  We reviewed the resident's history and exam and pertinent patient test results.  I agree with the assessment, diagnosis, and plan of care documented in the resident's note.    

## 2018-10-26 ENCOUNTER — Telehealth: Payer: Self-pay | Admitting: Dietician

## 2018-10-26 ENCOUNTER — Encounter: Payer: Medicaid Other | Admitting: Dietician

## 2018-10-26 NOTE — Telephone Encounter (Signed)
Patient missed his 2 week follow up for his diabetes self management today. I tried calling him to see how his blood sugar are now. I was unable to reach him because his voicemail box is not set up.

## 2018-10-27 NOTE — Telephone Encounter (Signed)
Hi Wayne Leonard! I tried calling him this morning with no luck either. I asked the front desk to give him a call to schedule an appt for him in [facility] this week or next week for DM follow up. He has an appt with me the first week of April, but this is a scheduling mistake as I will be on vacation that week.

## 2018-11-14 ENCOUNTER — Other Ambulatory Visit: Payer: Self-pay | Admitting: *Deleted

## 2018-11-14 DIAGNOSIS — I10 Essential (primary) hypertension: Secondary | ICD-10-CM

## 2018-11-14 DIAGNOSIS — E1165 Type 2 diabetes mellitus with hyperglycemia: Secondary | ICD-10-CM

## 2018-11-14 MED ORDER — LISINOPRIL 20 MG PO TABS: 20.0000 mg | ORAL_TABLET | Freq: Every day | ORAL | 3 refills | Status: DC

## 2018-11-14 MED ORDER — AMLODIPINE BESYLATE 10 MG PO TABS: 10.0000 mg | ORAL_TABLET | Freq: Every day | ORAL | 3 refills | Status: DC

## 2018-11-14 MED ORDER — INSULIN GLARGINE 100 UNIT/ML SOLOSTAR PEN: 30.0000 [IU] | PEN_INJECTOR | Freq: Every day | SUBCUTANEOUS | 3 refills | Status: DC

## 2018-11-14 MED ORDER — INSULIN ASPART 100 UNIT/ML FLEXPEN: 10.0000 [IU] | PEN_INJECTOR | Freq: Three times a day (TID) | SUBCUTANEOUS | 3 refills | Status: DC

## 2018-11-17 MED FILL — LISINOPRIL 20 MG TABLET: 20 | 30 days supply | Qty: 30 | Fill #0

## 2018-11-17 MED FILL — AMLODIPINE BESYLATE 10 MG T: 10 | 30 days supply | Qty: 30 | Fill #0

## 2018-11-17 MED FILL — NOVOLOG FLEXPEN SYRINGE: 100 | 30 days supply | Qty: 9 | Fill #0

## 2018-11-17 MED FILL — UNIFINE PENTIPS 6MM 31G: 31G X 6 MM | 30 days supply | Qty: 100 | Fill #0

## 2018-11-17 MED FILL — LANTUS SOLOSTAR 100 UNITS/M: 100 | 30 days supply | Qty: 9 | Fill #0

## 2018-11-18 ENCOUNTER — Telehealth: Payer: Self-pay | Admitting: Dietician

## 2018-11-18 ENCOUNTER — Encounter: Payer: Medicaid Other | Admitting: Internal Medicine

## 2018-11-18 ENCOUNTER — Encounter: Payer: Medicaid Other | Admitting: Dietician

## 2018-11-18 DIAGNOSIS — E1110 Type 2 diabetes mellitus with ketoacidosis without coma: Secondary | ICD-10-CM

## 2018-11-18 NOTE — Telephone Encounter (Signed)
Tried calling this patient. Their voicemail box is not set up. I was unable to leave a message  

## 2018-11-21 ENCOUNTER — Encounter: Payer: Self-pay | Admitting: Dietician

## 2018-11-21 ENCOUNTER — Ambulatory Visit: Payer: Medicaid Other | Admitting: Dietician

## 2018-11-21 NOTE — Patient Instructions (Addendum)
Hi Mr. Kodama,   Congrats on lowering your blood sugars!!!!  Thank you for speaking with me today. We talked about:   1-Taking your NOVOLOG insulin about 15 minutes BEFORE eating for it to work it's best and prevent low blood sugars between meals.   2- Also, I will be sure to let Dr. Lovenia Kim know about the metformin.   3- I put an appointment on the books to follow up with me in 1 month. We'll see if that will be an in-person visit or a telephone visit like today. If in -person, can you please bring your meter with you?   Thanks and as always, please call with questions or concerns.   Lupita Leash [phone]

## 2018-11-21 NOTE — Progress Notes (Signed)
Referral received for diabetes education from Dr. Lovenia Kim.  Wayne Leonard is a 43 y.o. male who was contacted on behalf of [facility] nutrition and diabetes services Start time:1357  End time: 1416.26  This is a telephone encounter between Jolene Schimke and Lupita Leash Plyler on 11/21/2018 for diabetes. The visit was conducted with the patient located at home and Norm Parcel at [facility]. The patient's identity was confirmed using their DOB and current address. The patient has consented to being evaluated through a telephone encounter and understands the associated risks / benefits (allows the patient to remain at home, decreasing exposure to coronavirus). I personally spent 19.26 minutes on diabetes self managment discussion.   He feels his diabetes is better controlled on the new insulin regemin. I educated him about taking his meal time insulin ~ 15 minutes before eating for improved blood sugars/insulin action and prevention of hypoglycemia   Diet:  what meal plan do you follow? Breakfast- 10 am, bacon and eggs, no bread, no grits, a banana, sunny d- 1/2 cup, lunch "when he gets hungry", 3rd meal 9-10 pm- at home wife and I cook-sloppy joe and french fries, ginger ale- regular  tell me about how you drink: mostly water  Monitoring:   If so how often and when ? 3x/day What are the results? 1pm- 187, meter is at home, mostly in the 100s, 104 is the lowest he can remember symptoms of high and low blood sugar: no, is able to state symptoms of high- thirsty, goes to the bathroom every 30 minutes and low blood sugar- nervous fidgety  Medications:  what medications are you taking for diabetes? 3x/day 10 units, bedtime 24 hours 30  What are names and when do you take them Are you getting your medicines filled on time without skipping any doses? yes "Are you having any problems getting/taking your meds (cost, timing, transportation)?" no Do you need any meds refilled? No Does not take metformin, off since  January.   Exercise:  What type of activity are you doing? Staying in , usually How often- how many days a week and how many minutes each time- no  Sleep: What time do you wake up? 8 am What time do you go to sleep? 12-1 am How often to wake up during that time?normal  Wt Readings from Last 5 Encounters:  10/11/18 266 lb 3.2 oz (120.7 kg)  02/25/18 (!) 322 lb 3.2 oz (146.1 kg)  12/22/17 (!) 316 lb 11.2 oz (143.7 kg)  12/06/17 (!) 321 lb 11.2 oz (145.9 kg)  11/28/17 300 lb (136.1 kg)   Lab Results  Component Value Date   HGBA1C >14.0 (A) 10/11/2018   HGBA1C 7.2 (A) 02/25/2018   HGBA1C 14.3 (H) 11/28/2017   Date of follow-up visit scheduled- tbd, he agreed to a 1  Month follow up Any other questions or concerns?" no  My plan to support myself in continuing these changes to care for my diabetes is to attend or contact:   Diabetes Support Groups His family -doctor's office, CDE, Dietitian, pharmacist  Norm Parcel, RD 11/21/2018 1:58 PM.

## 2018-11-21 NOTE — Telephone Encounter (Signed)
Mr. Fenters agreed to a telephon visit. See nutrtion note dated today.

## 2018-12-14 MED FILL — AMLODIPINE BESYLATE 10 MG T: 10 | 30 days supply | Qty: 30 | Fill #1

## 2018-12-14 MED FILL — NOVOLOG FLEXPEN SYRINGE: 100 | 30 days supply | Qty: 9 | Fill #1

## 2018-12-14 MED FILL — LANTUS SOLOSTAR 100 UNITS/M: 100 | 30 days supply | Qty: 9 | Fill #1

## 2018-12-14 MED FILL — LISINOPRIL 20 MG TABLET: 20 | 30 days supply | Qty: 30 | Fill #1

## 2018-12-19 ENCOUNTER — Ambulatory Visit: Payer: Medicaid Other | Admitting: Dietician

## 2018-12-19 ENCOUNTER — Telehealth: Payer: Self-pay | Admitting: Dietician

## 2018-12-19 ENCOUNTER — Encounter: Payer: Medicaid Other | Admitting: Dietician

## 2018-12-19 NOTE — Telephone Encounter (Signed)
Tried calling this patient for his appointment today. Their voicemail box is not set up. I was unable to leave a message. Will try back later Norm Parcel, RD 12/19/2018 2:28 PM.

## 2018-12-20 NOTE — Telephone Encounter (Signed)
Tried calling this patient. Their voicemail box is not set up. I was unable to leave a message Norm Parcel, RD 12/20/2018 4:58 PM.

## 2018-12-21 ENCOUNTER — Encounter: Payer: Self-pay | Admitting: Dietician

## 2018-12-21 NOTE — Telephone Encounter (Signed)
Tried calling this patient. Their voicemail box is not set up. I was unable to leave a message. Will mail letter encouraging patient to call for follow up. Norm Parcel, RD 12/21/2018 9:38 AM.

## 2018-12-23 ENCOUNTER — Encounter: Payer: Medicaid Other | Admitting: Internal Medicine

## 2018-12-23 ENCOUNTER — Other Ambulatory Visit: Payer: Self-pay

## 2018-12-25 ENCOUNTER — Telehealth: Payer: Self-pay | Admitting: Internal Medicine

## 2018-12-25 NOTE — Telephone Encounter (Signed)
Called patient several times to all the different numbers listed in his chart. No answer. Unable to leave message, voicemail not set up.

## 2019-01-30 ENCOUNTER — Other Ambulatory Visit: Payer: Self-pay | Admitting: *Deleted

## 2019-01-30 MED ORDER — UNIFINE PENTIPS 31G X 6 MM MISC: 1.0000 | Freq: Three times a day (TID) | 3 refills | Status: DC

## 2019-01-30 MED FILL — LANTUS SOLOSTAR 100 UNITS/M: 100 | 30 days supply | Qty: 9 | Fill #2

## 2019-01-30 MED FILL — NOVOLOG FLEXPEN SYRINGE: 100 | 30 days supply | Qty: 9 | Fill #2

## 2019-01-30 NOTE — Telephone Encounter (Signed)
Received fax from [place] requesting refill on Unifine Pentips 47mm 31g. Not on current med list. Will route to PCP. Hubbard Hartshorn, RN, BSN

## 2019-01-31 MED FILL — UNIFINE PENTIPS 6MM 31G: 31G X 6 MM | 33 days supply | Qty: 100 | Fill #0

## 2019-03-09 MED FILL — NOVOLOG FLEXPEN SYRINGE: 100 | 30 days supply | Qty: 9 | Fill #0

## 2019-03-15 ENCOUNTER — Telehealth: Payer: Self-pay | Admitting: *Deleted

## 2019-03-15 NOTE — Telephone Encounter (Signed)
Call to [facility] to check on status of prescription for.  Patient has [employer].  Prescription was sent over under the IM Program for $4.  Spoke with Pharmacist Richardson Landry who said that patient's  Medication  is ready for pick up at $4.  Patient was called and informed of and will pick up at the [facility].  Sander Nephew, RN 03/15/2019 9:35 AM.

## 2019-04-05 MED FILL — NOVOLOG FLEXPEN SYRINGE: 100 | 30 days supply | Qty: 9 | Fill #0

## 2019-04-14 MED FILL — LANTUS SOLOSTAR 100 UNITS/M: 100 | 30 days supply | Qty: 9 | Fill #0

## 2019-04-14 MED FILL — UNIFINE PENTIPS 6MM 31G: 31G X 6 MM | 30 days supply | Qty: 100 | Fill #0

## 2019-04-14 MED FILL — NOVOLOG FLEXPEN SYRINGE: 100 | 30 days supply | Qty: 9 | Fill #0

## 2019-07-03 ENCOUNTER — Encounter: Payer: Self-pay | Admitting: Internal Medicine

## 2019-07-03 ENCOUNTER — Encounter: Payer: Medicaid Other | Admitting: Internal Medicine

## 2019-08-07 MED FILL — NOVOLOG FLEXPEN SYRINGE: 100 | 30 days supply | Qty: 9 | Fill #1

## 2019-08-07 MED FILL — UNIFINE PENTIPS 6MM 31G: 31G X 6 MM | 30 days supply | Qty: 100 | Fill #1

## 2019-08-09 LAB — HM DIABETES EYE EXAM

## 2019-12-11 ENCOUNTER — Encounter: Payer: Self-pay | Admitting: *Deleted

## 2019-12-14 ENCOUNTER — Other Ambulatory Visit: Payer: Self-pay

## 2019-12-14 DIAGNOSIS — E1165 Type 2 diabetes mellitus with hyperglycemia: Secondary | ICD-10-CM

## 2019-12-14 NOTE — Telephone Encounter (Signed)
Insulin Pen Needle (UNIFINE PENTIPS) 31G X 6 MM MISC   insulin aspart (NOVOLOG) 100 UNIT/ML FlexPen, REFILL REQUEST @  [facility] [place], [place] - [address]. [phone] (Phone) [phone] (Fax)

## 2019-12-15 ENCOUNTER — Other Ambulatory Visit: Payer: Self-pay | Admitting: Internal Medicine

## 2019-12-15 ENCOUNTER — Telehealth: Payer: Self-pay | Admitting: *Deleted

## 2019-12-15 MED ORDER — LANTUS SOLOSTAR 100 UNIT/ML ~~LOC~~ SOPN: 30.0000 [IU] | PEN_INJECTOR | Freq: Every day | SUBCUTANEOUS | 3 refills | Status: DC

## 2019-12-15 MED ORDER — UNIFINE PENTIPS 31G X 6 MM MISC: 1.0000 | Freq: Three times a day (TID) | 1 refills | Status: DC

## 2019-12-15 MED ORDER — INSULIN ASPART 100 UNIT/ML FLEXPEN: 10.0000 [IU] | PEN_INJECTOR | Freq: Three times a day (TID) | SUBCUTANEOUS | 3 refills | Status: DC

## 2019-12-15 MED ORDER — INSULIN LISPRO (1 UNIT DIAL) 100 UNIT/ML (KWIKPEN): 10.0000 [IU] | PEN_INJECTOR | Freq: Three times a day (TID) | SUBCUTANEOUS | 11 refills | Status: DC

## 2019-12-15 MED FILL — HUMALOG 100 UNITS/ML KWIKPE: 100 | 30 days supply | Qty: 9 | Fill #0

## 2019-12-15 MED FILL — UNIFINE PENTIPS 6MM 31G: 31G X 6 MM | 30 days supply | Qty: 100 | Fill #0

## 2019-12-15 NOTE — Telephone Encounter (Signed)
PCP appt ASAP DM

## 2019-12-15 NOTE — Telephone Encounter (Signed)
Verbal order given to [facility] Outpt pharmacist to switch Novolog to Humalog kwikpen with same instructions/directions per Dr Rogelia Boga.

## 2019-12-15 NOTE — Telephone Encounter (Signed)
Patient calling from [facility] Outpatient pharmacy. States he really needs his insulin and supplies today. Forwarding to Attending. Kinnie Feil, BSN, RN-BC

## 2019-12-15 NOTE — Telephone Encounter (Signed)
Call from [facility] Outpt pharmacy - stated pt has insurance now; need to change Novolog to Humalog flex pens. Pt is currently at the pharmacy. Please send new rx. Thanks

## 2019-12-18 NOTE — Telephone Encounter (Signed)
The patient sch her appt for 01/08/2020 with her PCP.

## 2020-01-08 ENCOUNTER — Encounter: Payer: Medicaid Other | Admitting: Internal Medicine

## 2020-01-29 ENCOUNTER — Ambulatory Visit (INDEPENDENT_AMBULATORY_CARE_PROVIDER_SITE_OTHER): Payer: BC Managed Care – PPO | Admitting: Internal Medicine

## 2020-01-29 ENCOUNTER — Other Ambulatory Visit: Payer: Self-pay | Admitting: Internal Medicine

## 2020-01-29 ENCOUNTER — Encounter: Payer: Self-pay | Admitting: Internal Medicine

## 2020-01-29 ENCOUNTER — Other Ambulatory Visit: Payer: Self-pay

## 2020-01-29 VITALS — BP 136/92 | HR 81 | Temp 98.6°F | Ht 69.75 in | Wt 238.9 lb

## 2020-01-29 DIAGNOSIS — I10 Essential (primary) hypertension: Secondary | ICD-10-CM

## 2020-01-29 DIAGNOSIS — E785 Hyperlipidemia, unspecified: Secondary | ICD-10-CM | POA: Diagnosis not present

## 2020-01-29 DIAGNOSIS — R634 Abnormal weight loss: Secondary | ICD-10-CM

## 2020-01-29 DIAGNOSIS — E1165 Type 2 diabetes mellitus with hyperglycemia: Secondary | ICD-10-CM

## 2020-01-29 LAB — POCT GLYCOSYLATED HEMOGLOBIN (HGB A1C): HbA1c POC (<> result, manual entry): >14 % — AB (ref 4.0–5.6)

## 2020-01-29 LAB — GLUCOSE, CAPILLARY: Glucose-Capillary: 292 mg/dL — ABNORMAL HIGH (ref 70–99)

## 2020-01-29 MED ORDER — INVOKAMET 50-500 MG PO TABS: 50.0000 mg | ORAL_TABLET | Freq: Two times a day (BID) | ORAL | 1 refills | Status: DC

## 2020-01-29 MED ORDER — LANTUS SOLOSTAR 100 UNIT/ML ~~LOC~~ SOPN: 30.0000 [IU] | PEN_INJECTOR | Freq: Every day | SUBCUTANEOUS | 3 refills | Status: DC

## 2020-01-29 MED ORDER — INSULIN LISPRO (1 UNIT DIAL) 100 UNIT/ML (KWIKPEN): 5.0000 [IU] | PEN_INJECTOR | Freq: Three times a day (TID) | SUBCUTANEOUS | 11 refills | Status: DC

## 2020-01-29 MED FILL — LANTUS SOLOSTAR 100 UNITS/M: 100 | 30 days supply | Qty: 9 | Fill #0

## 2020-01-29 NOTE — Assessment & Plan Note (Signed)
Declined statin.  Will discuss at future visit.

## 2020-01-29 NOTE — Assessment & Plan Note (Signed)
Patient presents for follow up of uncontrolled T2DM. He was last seen in 09/2018 at which time he was not using insulin. He is supposed to be on metformin 100 BID, Lantus 30 unitd QD, and Humalog 10 units TID. Currently only using Novolog 2-3x per day. A1c is > 14 today. 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 He is experiencing polyuria and increased thirst but no urinary frequency or urgency. Does not check BGs at home. We discussed importance of glycemic control to prevent macro and micro-vascular complications of diabetes which he is not experiencing at this time.   - Lantus 30 units  - Humalog 5-10 units TID / meals  - Invokamet 50-500 mg Bid, uptitrate in 2 weeks to [phone] BID  - Will likely benefit from Xultophy and recommend discussing this with patient at next visit  - Patient declined starting statin today, discuss at next visit  - Foot exam and urine microalbumin performed today

## 2020-01-29 NOTE — Progress Notes (Signed)
   CC: T2DM follow up  HPI:  [name] is a 44 y.o. year-old male with PMH listed below who presents to clinic for T2DM follow up. Please see problem based assessment and plan for further details.   Past Medical History:  Diagnosis Date  . DKA (diabetic ketoacidoses) ([facility]) 11/29/2017  . Essential hypertension 11/23/2007  . Hyperlipidemia 11/23/2007  . Intermittent asthma, well controlled 11/23/2007  . Obstructive sleep apnea 01/03/2008  . Pancreatitis, alcoholic, acute 2001  . Type 2 diabetes mellitus without complication, without long-term current use of insulin ([facility]) 09/24/2015   Review of Systems:   Review of Systems  Constitutional: Negative for chills, fever, malaise/fatigue and weight loss.  Respiratory: Negative for cough, hemoptysis and shortness of breath.   Cardiovascular: Negative for chest pain and palpitations.  Gastrointestinal: Negative for abdominal pain, nausea and vomiting.  Genitourinary: Negative for frequency and urgency.  Neurological: Negative for dizziness and headaches.     Physical Exam:  Vitals:   01/29/20 1322  BP: (!) 136/92  Pulse: 81  Temp: 98.6 F (37 C)  TempSrc: Oral  SpO2: 98%  Weight: 238 lb 14.4 oz (108.4 kg)  Height: 5' 9.75" (1.772 m)    General: young male, well-appearing in NAD  Cardiac: regular rate and rhythm, nl S1/S2, no murmurs, rubs or gallops  Pulm: CTAB, no wheezes or crackles, no increased work of breathing on room air  Ext: warm and well perfused, no peripheral edema   Assessment & Plan:   See Encounters Tab for problem based charting.  Patient discussed with Dr. Antony Contras

## 2020-01-29 NOTE — Assessment & Plan Note (Signed)
Patient has had a 90 pound weight loss in the past year.  He says some this is intentional as he is much more active now at his new job.  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  He is a smoker.  Does not have family history of lung cancer.  He denies fever, chills, malaise/fatigue, shortness of breath, cough, abdominal pain,N/V, constipation.  Last TSH was normal in 09/2018.  I suspect this is secondary to hyperglycemia and ketosis in the setting of uncontrolled diabetes.  I will not order a work-up at this time as he is asymptomatic.

## 2020-01-29 NOTE — Patient Instructions (Addendum)
[name],   Please start taking Lantus 30 untis daily. I decrease the dose of your Humalog to 5 units 2-3 times per day with meals.   Please also start taking Invokamet 1 t ablet 2 times a day.   Make a follow up appointment with [place] in 3 months to follow up on your diabetes.   - Dr. Evelene Croon

## 2020-01-29 NOTE — Assessment & Plan Note (Signed)
At goal off of blood pressure medications in the setting of 90 lbs weight loss in the past year. Will need to  monitor in future visits asI do expect him to gain weight if he is compliant with his insulin and may need to resume antihypertensives.

## 2020-01-29 NOTE — Progress Notes (Signed)
Internal Medicine Clinic Attending  Case discussed with Dr. Santos-Sanchez at the time of the visit.  We reviewed the resident's history and exam and pertinent patient test results.  I agree with the assessment, diagnosis, and plan of care documented in the resident's note.    

## 2020-01-30 LAB — BMP8+ANION GAP
Anion Gap: 19 mmol/L — ABNORMAL HIGH (ref 10.0–18.0)
BUN/Creatinine Ratio: 9 (ref 9–20)
BUN: 9 mg/dL (ref 6–24)
CO2: 20 mmol/L (ref 20–29)
Calcium: 9.6 mg/dL (ref 8.7–10.2)
Chloride: 99 mmol/L (ref 96–106)
Creatinine, Ser: 1.01 mg/dL (ref 0.76–1.27)
GFR calc Af Amer: 105 mL/min/{1.73_m2} (ref >59)
GFR calc non Af Amer: 91 mL/min/{1.73_m2} (ref >59)
Glucose: 274 mg/dL — ABNORMAL HIGH (ref 65–99)
Potassium: 4.7 mmol/L (ref 3.5–5.2)
Sodium: 138 mmol/L (ref 134–144)

## 2020-01-30 LAB — MICROALBUMIN / CREATININE URINE RATIO
Creatinine, Urine: 139.4 mg/dL
Microalb/Creat Ratio: 42 mg/g creat — ABNORMAL HIGH (ref 0–29)
Microalbumin, Urine: 58.4 ug/mL

## 2020-01-31 ENCOUNTER — Telehealth: Payer: Self-pay | Admitting: *Deleted

## 2020-01-31 NOTE — Telephone Encounter (Signed)
Information was sent through [facility] for PA for Invokamet 50-500 MG.   Awaiting determination within 73 hours.  Angelina Ok, RN 01/31/2020 12:17 PM.

## 2020-02-12 ENCOUNTER — Other Ambulatory Visit: Payer: Self-pay | Admitting: Internal Medicine

## 2020-02-12 DIAGNOSIS — E1165 Type 2 diabetes mellitus with hyperglycemia: Secondary | ICD-10-CM

## 2020-02-12 MED ORDER — SYNJARDY 5-500 MG PO TABS: 1.0000 | ORAL_TABLET | Freq: Two times a day (BID) | ORAL | 0 refills | Status: DC

## 2020-02-12 MED FILL — SYNJARDY 5-500 MG TABLET: 5-500 | 30 days supply | Qty: 60 | Fill #0

## 2020-02-12 NOTE — Telephone Encounter (Signed)
Reviewed prior authorization information from [employer]. Synjardy (empagliflozin plus metformin) is preferred over invokamet, will prescribe synjardy instead starting with 5 mg/500 mg BID, can increase at follow up appt if tolerating. Should be seen soon in follow up by PCP or in [facility]

## 2020-02-22 ENCOUNTER — Encounter: Payer: Self-pay | Admitting: Student

## 2020-02-22 NOTE — Telephone Encounter (Signed)
The patient has been called with no response.  Mailed patient a letter to contact the office to schedule an appointment as soon as possible.

## 2020-04-09 MED FILL — UNIFINE PENTIPS 6MM 31G: 31G X 6 MM | 30 days supply | Qty: 100 | Fill #1

## 2020-04-09 MED FILL — LANTUS SOLOSTAR 100 UNITS/M: 100 | 30 days supply | Qty: 9 | Fill #1

## 2020-05-28 MED FILL — LANTUS SOLOSTAR 100 UNITS/M: 100 | 30 days supply | Qty: 9 | Fill #2

## 2020-05-28 MED FILL — UNIFINE PENTIPS 6MM 31G: 31G X 6 MM | 30 days supply | Qty: 100 | Fill #2

## 2020-07-22 MED FILL — LANTUS SOLOSTAR 100 UNITS/M: 100 | 30 days supply | Qty: 9 | Fill #3

## 2020-07-22 MED FILL — UNIFINE PENTIPS 6MM 31G: 31G X 6 MM | 30 days supply | Qty: 100 | Fill #3

## 2020-09-04 MED FILL — UNIFINE PENTIPS 6MM 31G: 31G X 6 MM | 30 days supply | Qty: 100 | Fill #4

## 2020-09-04 MED FILL — LANTUS SOLOSTAR 100 UNITS/M: 100 | 30 days supply | Qty: 9 | Fill #4

## 2020-10-17 MED FILL — LANTUS SOLOSTAR 100 UNITS/M: 100 | 30 days supply | Qty: 9 | Fill #5

## 2020-12-19 ENCOUNTER — Other Ambulatory Visit (HOSPITAL_COMMUNITY): Payer: Self-pay

## 2020-12-19 MED FILL — Insulin Glargine Soln Pen-Injector 100 Unit/ML: SUBCUTANEOUS | 20 days supply | Qty: 6 | Fill #0 | Status: AC

## 2020-12-25 ENCOUNTER — Other Ambulatory Visit (HOSPITAL_COMMUNITY): Payer: Self-pay

## 2021-01-09 ENCOUNTER — Encounter: Payer: Self-pay | Admitting: *Deleted

## 2023-10-28 ENCOUNTER — Other Ambulatory Visit (HOSPITAL_COMMUNITY): Payer: Self-pay

## 2023-10-28 ENCOUNTER — Ambulatory Visit (INDEPENDENT_AMBULATORY_CARE_PROVIDER_SITE_OTHER): Payer: Self-pay | Admitting: Student

## 2023-10-28 ENCOUNTER — Other Ambulatory Visit: Payer: Self-pay

## 2023-10-28 VITALS — BP 170/113 | HR 91 | Ht 70.0 in | Wt 281.2 lb

## 2023-10-28 DIAGNOSIS — E785 Hyperlipidemia, unspecified: Secondary | ICD-10-CM

## 2023-10-28 DIAGNOSIS — E1165 Type 2 diabetes mellitus with hyperglycemia: Secondary | ICD-10-CM

## 2023-10-28 DIAGNOSIS — Z7984 Long term (current) use of oral hypoglycemic drugs: Secondary | ICD-10-CM | POA: Diagnosis not present

## 2023-10-28 DIAGNOSIS — I10 Essential (primary) hypertension: Secondary | ICD-10-CM

## 2023-10-28 LAB — GLUCOSE, CAPILLARY: Glucose-Capillary: 312 mg/dL — ABNORMAL HIGH (ref 70–99)

## 2023-10-28 LAB — POCT GLYCOSYLATED HEMOGLOBIN (HGB A1C): Hemoglobin A1C: 12.2 % — AB (ref 4.0–5.6)

## 2023-10-28 MED ORDER — TRUEPLUS LANCETS 33G MISC
Freq: Three times a day (TID) | 12 refills | Status: DC
  Filled 2023-10-28: qty 100, 33d supply, fill #0
  Filled 2023-10-28: qty 100, 30d supply, fill #0

## 2023-10-28 MED ORDER — EMPAGLIFLOZIN 25 MG PO TABS: 25.0000 mg | ORAL_TABLET | Freq: Every day | ORAL | Status: DC

## 2023-10-28 MED ORDER — TRUE METRIX BLOOD GLUCOSE TEST VI STRP
ORAL_STRIP | Freq: Three times a day (TID) | 12 refills | Status: AC
  Filled 2023-10-28: qty 100, 33d supply, fill #0
  Filled 2023-10-28: qty 100, 30d supply, fill #0

## 2023-10-28 MED ORDER — BLOOD GLUCOSE MONITOR KIT: PACK | 0 refills | Status: DC

## 2023-10-28 MED ORDER — BLOOD GLUCOSE MONITOR SYSTEM W/DEVICE KIT
1.0000 | PACK | Freq: Three times a day (TID) | 0 refills | Status: DC
  Filled 2023-10-28: qty 1, fill #0
  Filled 2023-10-28: qty 1, 30d supply, fill #0

## 2023-10-28 NOTE — Assessment & Plan Note (Signed)
 LDL 8 years ago was 113.  Will continue current regimen - Follow-up lipid panel at next visit - Continue atorvastatin 20 mg

## 2023-10-28 NOTE — Patient Instructions (Signed)
 Thank you so much for coming to the clinic today!   Please resume your prescribed medications.  Please measure your glucose three times per day.   Please start taking the Jardiance 25 mg daily.  We will follow-up in three weeks.   If you have any questions please feel free to the call the clinic at anytime at [phone]. It was a pleasure seeing you!  Best, Dr. Rayvon Char

## 2023-10-28 NOTE — Assessment & Plan Note (Signed)
 Patient currently taking amlodipine 10 mg daily.  He brought in his pill bottles at home.  He states that he has not been taking his medications for the past 1 to 2 weeks since coming home from prison as he felt it was contributing to his erectile dysfunction.  His blood pressure today was 167/102.  His repeat blood pressure was 170/113.  At his next appointment, if his blood pressures remain elevated, consider adding an ARB. - Resume amlodipine 10 mg daily - Follow-up BMP

## 2023-10-28 NOTE — Assessment & Plan Note (Addendum)
 A1c today is slightly improved from greater than 14 to 12.2.  Patient brought his medications today from home that he was taking in prison.  He states that he is currently taking 55 units of glargine in the morning and 1000 mg of metformin twice daily.  He denies any signs or symptoms.  He notes that he also stopped taking his medications 10 days ago as described above.  He has been unable to measure his glucose at home as he is out of Lancets.  He is currently in the process of obtaining Medicaid coverage.  At this time, we will resume his home regimen of glargine and metformin.  He was provided with 4 weeks of 25 mg of Jardiance for further blood pressure and diabetes control.  I reached out to pharmacy for further patient assistance.  Patient was provided at home glucose monitoring and counseled on the importance of measuring his glucose at home 3 times per day and to follow-up with [place] in a few weeks for further glucose control. - Begin Jardiance 25 mg daily - Continue glargine 55 units daily - Continue metformin 1000 mg twice daily  Lot #  [id number] Exp. Date 1-27  Patient has been instructed regarding the correct time, dose, and frequency of taking this medication, including its desired effects and most common side effects.

## 2023-10-28 NOTE — Progress Notes (Addendum)
 CC: Diabetes management  HPI: [name] is a 48 y.o. male living with a history stated below and presents today for diabetes management. Please see problem based assessment and plan for additional details.  Past Medical History:  Diagnosis Date   DKA (diabetic ketoacidoses) (HCC) 11/29/2017   Essential hypertension 11/23/2007   Hyperlipidemia 11/23/2007   Intermittent asthma, well controlled 11/23/2007   Obstructive sleep apnea 01/03/2008   Pancreatitis, alcoholic, acute 2001   Type 2 diabetes mellitus without complication, without long-term current use of insulin (HCC) 09/24/2015    Current Outpatient Medications on Leonard Prior to Visit  Medication Sig Dispense Refill   amLODipine (NORVASC) 10 MG tablet Take 10 mg by mouth daily.     insulin glargine (LANTUS) 100 UNIT/ML injection Inject 55 Units into the skin daily.     albuterol (PROVENTIL HFA;VENTOLIN HFA) 108 (90 Base) MCG/ACT inhaler Inhale 2 puffs into the lungs every 6 (six) hours as needed for wheezing or shortness of breath. 1 Inhaler 6   insulin lispro (HUMALOG) 100 UNIT/ML KwikPen Inject 0.05 mLs (5 Units total) into the skin 3 (three) times daily. 15 mL 11   metFORMIN (GLUCOPHAGE) 500 MG tablet Take 2 tablets (1,000 mg total) by mouth 2 (two) times daily. 120 tablet 1   No current facility-administered medications on Leonard prior to visit.    Family History  Problem Relation Age of Onset   Asthma Father    CAD Father    Diabetes Mellitus II Mother    Hypertension Mother    Asthma Brother     Social History   Socioeconomic History   Marital status: Married    Spouse name: Wayne Leonard   Number of children: Wayne Leonard   Years of education: Wayne Leonard   Highest education level: Wayne Leonard  Occupational History   Wayne Leonard  Tobacco Use   Smoking status: Every Day    Current packs/day: 1.00    Average packs/day: 1 pack/day for 24.0 years (24.0 ttl pk-yrs)    Types: Cigarettes   Smokeless tobacco: Never    Tobacco comments:    smoking 1 PPD  Vaping Use   Vaping status: Never Used  Substance and Sexual Activity   Alcohol use: Wayne Currently    Alcohol/week: 0.0 standard drinks of alcohol   Drug use: No   Sexual activity: Yes    Partners: Female  Other Topics Concern   Wayne Leonard  Social History Narrative   Wayne Leonard   Social Drivers of Health   Financial Resource Strain: Wayne Leonard  Food Insecurity: No Food Insecurity (10/28/2023)   Hunger Vital Sign    Worried About Running Out of Food in the Last Year: Never true    Ran Out of Food in the Last Year: Never true  Transportation Needs: No Transportation Needs (10/28/2023)   PRAPARE - Administrator, Civil Service (Medical): No    Lack of Transportation (Non-Medical): No  Physical Activity: Wayne Leonard  Stress: Wayne Leonard  Social Connections: Wayne Leonard  Intimate Partner Violence: Wayne At Risk (10/28/2023)   Humiliation, Afraid, Rape, and Kick questionnaire    Fear of Current or Ex-Partner: No    Emotionally Abused: No    Physically Abused: No    Sexually Abused: No    Review of Systems: ROS negative except for what is noted on the assessment and plan.  Vitals:   10/28/23 1008 10/28/23  1030  BP: (!) 167/102 (!) 170/113  Pulse: 96 91  SpO2: 96%   Weight: 281 lb 3.2 oz (127.6 kg)   Height: 5\' 10"  (1.778 m)     Physical Exam: Constitutional: well-appearing in no acute distress HENT: normocephalic atraumatic, mucous membranes moist Eyes: conjunctiva non-erythematous Neck: supple Cardiovascular: regular rate and rhythm, no m/r/g Pulmonary/Chest: normal work of breathing on room air, lungs clear to auscultation bilaterally Abdominal: soft, non-tender, non-distended MSK: normal bulk and tone Neurological: alert & oriented x 3, 5/5 strength in bilateral upper and lower extremities, normal gait Skin: warm and dry  Assessment & Plan:   Essential hypertension Patient currently taking amlodipine 10 mg  daily.  He brought in his pill bottles at home.  He states that he has Wayne been taking his medications for the past 1 to 2 weeks since coming home from prison as he felt it was contributing to his erectile dysfunction.  His blood pressure today was 167/102.  His repeat blood pressure was 170/113.  At his next appointment, if his blood pressures remain elevated, consider adding an ARB. - Resume amlodipine 10 mg daily - Follow-up BMP  Hyperlipidemia LDL 8 years ago was 113.  Will continue current regimen - Follow-up lipid panel at next visit - Continue atorvastatin 20 mg    Uncontrolled type 2 diabetes mellitus with hyperglycemia (HCC) A1c today is slightly improved from greater than 14 to 12.2.  Patient brought his medications today from home that he was taking in prison.  He states that he is currently taking 55 units of glargine in the morning and 1000 mg of metformin twice daily.  He denies any signs or symptoms.  He notes that he also stopped taking his medications 10 days ago as described above.  He has been unable to measure his glucose at home as he is out of Lancets.  He is currently in the process of obtaining Medicaid coverage.  At this time, we will resume his home regimen of glargine and metformin.  He was provided with 4 weeks of 25 mg of Jardiance for further blood pressure and diabetes control.  I reached out to pharmacy for further patient assistance.  Patient was provided at home glucose monitoring and counseled on the importance of measuring his glucose at home 3 times per day and to follow-up with [place] in a few weeks for further glucose control. - Begin Jardiance 25 mg daily - Continue glargine 55 units daily - Continue metformin 1000 mg twice daily  Lot #  [id number] Exp. Date 1-27  Patient has been instructed regarding the correct time, dose, and frequency of taking this medication, including its desired effects and most common side effects.  Patient discussed with Dr.  Bary Leriche, MD  [facility] Internal Medicine, PGY-1 Date 10/28/2023 Time 12:21 PM

## 2023-10-28 NOTE — Addendum Note (Signed)
 Addended by: Morrie Sheldon on: 10/28/2023 12:11 PM   Modules accepted: Orders

## 2023-10-29 LAB — BMP8+ANION GAP
Anion Gap: 14 mmol/L (ref 10.0–18.0)
BUN/Creatinine Ratio: 8 — ABNORMAL LOW (ref 9–20)
BUN: 15 mg/dL (ref 6–24)
CO2: 24 mmol/L (ref 20–29)
Calcium: 9.5 mg/dL (ref 8.7–10.2)
Chloride: 100 mmol/L (ref 96–106)
Creatinine, Ser: 1.84 mg/dL — ABNORMAL HIGH (ref 0.76–1.27)
Glucose: 313 mg/dL — ABNORMAL HIGH (ref 70–99)
Potassium: 5 mmol/L (ref 3.5–5.2)
Sodium: 138 mmol/L (ref 134–144)
eGFR: 45 mL/min/{1.73_m2} — ABNORMAL LOW (ref >59)

## 2023-11-01 ENCOUNTER — Other Ambulatory Visit: Payer: Self-pay | Admitting: Student

## 2023-11-01 ENCOUNTER — Ambulatory Visit: Payer: Self-pay | Admitting: Student

## 2023-11-01 ENCOUNTER — Telehealth: Payer: Self-pay | Admitting: Student

## 2023-11-01 ENCOUNTER — Other Ambulatory Visit: Payer: Self-pay

## 2023-11-01 DIAGNOSIS — E1110 Type 2 diabetes mellitus with ketoacidosis without coma: Secondary | ICD-10-CM

## 2023-11-01 MED ORDER — INSULIN PEN NEEDLE 34G X 3.5 MM MISC: 1 refills | Status: DC

## 2023-11-01 NOTE — Telephone Encounter (Signed)
 RTC to patient needs to have Insulin Syringes sent the pharmacy. Got new prescriptions at last visit.  Please sent to [facility] in [place] as patient is in [place] today.

## 2023-11-01 NOTE — Telephone Encounter (Signed)
 Copied from CRM [id number]. Topic: Clinical - Prescription Issue >> Nov 01, 2023  3:58 PM Alvino Blood C wrote: Reason for CRM: Patient is currently at the pharmacy with a high blood sugar (402) and pharmacy advised patient his insuline needles are not ready   Chief Complaint: High blood sugar  Symptoms: Patient denies any symptoms  Disposition: [] ED /[] Urgent Care (no appt availability in office) / [] Appointment(In office/virtual)/ []  [place] [facility]/ [x] Home Care/ [] Refused Recommended Disposition /[] [place] [facility]/ []  Follow-up with PCP Additional Notes: Patient reports that his blood sugar was 402 today when he checked it. He denies any symptoms at this time. He states that he is out of insulin needles and when he went to pick them up the pharmacy was unable to provide him with them. Patient is on his way now to another pharmacy that will be able to provide him with the needles for his insulin. Patient will take his insulin and call back if it does not improve or if he begins to have symptoms.     Reason for Disposition  [1] Blood glucose > 300 mg/dL (62.1 mmol/L) AND [3] uses insulin (e.g., insulin-dependent, all people with type 1 diabetes)  Answer Assessment - Initial Assessment Questions 1. BLOOD GLUCOSE: "What is your blood glucose level?"      402 2. ONSET: "When did you check the blood glucose?"     Today  4. KETONES: "Do you check for ketones (urine or blood test strips)?" If Yes, ask: "What does the test show now?"      No 5. TYPE 1 or 2:  "Do you know what type of diabetes you have?"  (e.g., Type 1, Type 2, Gestational; doesn't know)      Type 2 6. INSULIN: "Do you take insulin?" "What type of insulin(s) do you use? What is the mode of delivery? (syringe, pen; injection or pump)?"      Yes, does not have needles  8. OTHER SYMPTOMS: "Do you have any symptoms?" (e.g., fever, frequent urination, difficulty breathing, dizziness, weakness, vomiting)     Patient denies any  symptoms  Protocols used: Diabetes - High Blood Sugar-A-AH

## 2023-11-01 NOTE — Telephone Encounter (Signed)
 Phone call was transferred to me from E2C2 agent. Patient wanted to speak with some one in office. Please refer to message below.  He was seen last week 10/28/23 by Dr. Morrie Sheldon.  Please call patient back at [phone].  Forwarding message to triage pool.

## 2023-11-01 NOTE — Telephone Encounter (Signed)
 Copied from CRM [id number]. Topic: Clinical - Prescription Issue >> Nov 01, 2023 12:07 PM Wayne Leonard wrote: Reason for CRM: Pt went to go pick up his diabetes medication today but he didn't get needles or syringes. He can't take his diabetes meds without the needles and needs it today. He would like a call back to let him know they've been sent over.

## 2023-11-02 ENCOUNTER — Telehealth: Payer: Self-pay

## 2023-11-02 ENCOUNTER — Telehealth: Payer: Self-pay | Admitting: *Deleted

## 2023-11-02 ENCOUNTER — Other Ambulatory Visit: Payer: Self-pay | Admitting: Student

## 2023-11-02 DIAGNOSIS — E1110 Type 2 diabetes mellitus with ketoacidosis without coma: Secondary | ICD-10-CM

## 2023-11-02 MED ORDER — INSULIN SYRINGE 31G X 5/16" 1 ML MISC: 0 refills | Status: DC

## 2023-11-02 NOTE — Addendum Note (Signed)
 Addended by: Burnell Blanks on: 11/02/2023 09:04 AM   Modules accepted: Level of Service

## 2023-11-02 NOTE — Telephone Encounter (Signed)
 Copied from CRM [id number]. Topic: Clinical - Prescription Issue >> Nov 02, 2023  8:54 AM Dennison Nancy wrote: Reason for CRM: [facility] and patient needs to have Insulin Syringes and needles Patient stated he need the whole thing not just needle tips   nurse Angelina Ok sent to pharmacy     [phone] [place] [place] [facility] [address] AT [facility] [address], [address] [place] [address] Phone: [phone] Fax: [phone]

## 2023-11-02 NOTE — Progress Notes (Signed)
 Internal Medicine Clinic Attending  Case discussed with the resident at the time of the visit.  We reviewed the resident's history and exam and pertinent patient test results.  I agree with the assessment, diagnosis, and plan of care documented in the resident's note.

## 2023-11-02 NOTE — Telephone Encounter (Signed)
 See previous note

## 2023-11-02 NOTE — Telephone Encounter (Signed)
 Pt was called and informed of rx for insulin syringes.

## 2023-11-02 NOTE — Telephone Encounter (Signed)
 I called pt who stated he's on Glargine vial; he needs insulin syringes (not pen needles). [facility] [address]

## 2023-11-02 NOTE — Telephone Encounter (Signed)
-----   Message from Morrie Sheldon sent at 10/28/2023 11:52 AM EDT ----- Regarding: [place] [facility] Patient Assistance [place] Pedigo,  Could we help with patient assistance approval for 25 mg of Jardiance? I provided the patient with a 4 week supply today. Thanks!

## 2023-11-18 ENCOUNTER — Ambulatory Visit: Payer: Self-pay | Admitting: Student

## 2023-11-18 ENCOUNTER — Ambulatory Visit: Admitting: Dietician

## 2023-11-18 ENCOUNTER — Other Ambulatory Visit: Payer: Self-pay

## 2023-11-18 ENCOUNTER — Telehealth: Payer: Self-pay

## 2023-11-18 ENCOUNTER — Other Ambulatory Visit (HOSPITAL_COMMUNITY): Payer: Self-pay

## 2023-11-18 VITALS — BP 149/91 | HR 89 | Temp 98.4°F | Ht 70.0 in | Wt 279.2 lb

## 2023-11-18 DIAGNOSIS — E1110 Type 2 diabetes mellitus with ketoacidosis without coma: Secondary | ICD-10-CM

## 2023-11-18 DIAGNOSIS — Z7984 Long term (current) use of oral hypoglycemic drugs: Secondary | ICD-10-CM

## 2023-11-18 DIAGNOSIS — Z794 Long term (current) use of insulin: Secondary | ICD-10-CM | POA: Diagnosis not present

## 2023-11-18 DIAGNOSIS — I10 Essential (primary) hypertension: Secondary | ICD-10-CM | POA: Diagnosis not present

## 2023-11-18 DIAGNOSIS — E1165 Type 2 diabetes mellitus with hyperglycemia: Secondary | ICD-10-CM

## 2023-11-18 MED ORDER — DEXCOM G7 RECEIVER DEVI
1 refills | Status: DC
  Filled 2023-11-18: qty 1, 90d supply, fill #0

## 2023-11-18 MED ORDER — INSULIN PEN NEEDLE 32G X 4 MM MISC
11 refills | Status: DC
  Filled 2023-11-18: qty 200, 40d supply, fill #0
  Filled 2024-02-22: qty 200, 40d supply, fill #1
  Filled 2024-08-21: qty 200, 40d supply, fill #0

## 2023-11-18 MED ORDER — DEXCOM G7 SENSOR MISC
3 refills | Status: DC
  Filled 2023-11-18 – 2023-11-22 (×3): qty 3, 30d supply, fill #0
  Filled 2023-12-16: qty 3, 30d supply, fill #1
  Filled 2024-01-17 – 2024-01-28 (×2): qty 3, 30d supply, fill #2
  Filled 2024-02-22: qty 3, 30d supply, fill #3

## 2023-11-18 MED ORDER — INSULIN GLARGINE 100 UNIT/ML SOLOSTAR PEN
30.0000 [IU] | PEN_INJECTOR | Freq: Two times a day (BID) | SUBCUTANEOUS | 3 refills | Status: DC
  Filled 2023-11-18: qty 18, 30d supply, fill #0

## 2023-11-18 NOTE — Assessment & Plan Note (Signed)
 Current medication regimen includes amlodipine 10 mg daily.  At his last visit, he had not been taking his medications.  His blood pressure at that time was 167/102.  His repeat blood pressure was 170/113.  Today, he was counseled on logging his blood pressures at home.  His blood pressure in the office is 149/91.  He did not take his blood pressure medications this morning.  No changes to his regimen at this time - Continue amlodipine 10 mg daily and Jardiance 25 mg daily

## 2023-11-18 NOTE — Progress Notes (Signed)
 CC: diabetes follow-up  HPI: [name] is a 48 y.o. male living with a history stated below and presents today for diabetes follow-up. Please see problem based assessment and plan for additional details.  Past Medical History:  Diagnosis Date   DKA (diabetic ketoacidoses) (HCC) 11/29/2017   Essential hypertension 11/23/2007   Hyperlipidemia 11/23/2007   Intermittent asthma, well controlled 11/23/2007   Obstructive sleep apnea 01/03/2008   Pancreatitis, alcoholic, acute 2001   Type 2 diabetes mellitus without complication, without long-term current use of insulin (HCC) 09/24/2015    Current Outpatient Medications on File Prior to Visit  Medication Sig Dispense Refill   albuterol (PROVENTIL HFA;VENTOLIN HFA) 108 (90 Base) MCG/ACT inhaler Inhale 2 puffs into the lungs every 6 (six) hours as needed for wheezing or shortness of breath. 1 Inhaler 6   amLODipine (NORVASC) 10 MG tablet Take 10 mg by mouth daily.     blood glucose meter kit and supplies KIT Dispense based on patient and insurance preference. Use up to four times daily as directed. (FOR ICD-9 250.00, 250.01). 1 each 0   Blood Glucose Monitoring Suppl (BLOOD GLUCOSE MONITOR SYSTEM) w/Device KIT Use to check blood glucose in the morning, at noon, and at bedtime. 1 kit 0   empagliflozin (JARDIANCE) 25 MG TABS tablet Take 1 tablet (25 mg total) by mouth daily before breakfast. 28 tablet    glucose blood (TRUE METRIX BLOOD GLUCOSE TEST) test strip Check blood sugars 3 (three) times daily. 100 each 12   insulin lispro (HUMALOG) 100 UNIT/ML KwikPen Inject 0.05 mLs (5 Units total) into the skin 3 (three) times daily. 15 mL 11   Insulin Syringe-Needle U-100 (INSULIN SYRINGE 1CC/31GX5/16") 31G X 5/16" 1 ML MISC Use once per day with insulin 100 each 0   metFORMIN (GLUCOPHAGE) 500 MG tablet Take 2 tablets (1,000 mg total) by mouth 2 (two) times daily. 120 tablet 1   TRUEplus Lancets 33G MISC Use as instructed to monitor glucose three times  daily 100 each 12   No current facility-administered medications on file prior to visit.    Family History  Problem Relation Age of Onset   Asthma Father    CAD Father    Diabetes Mellitus II Mother    Hypertension Mother    Asthma Brother     Social History   Socioeconomic History   Marital status: Married    Spouse name: Not on file   Number of children: Not on file   Years of education: Not on file   Highest education level: Not on file  Occupational History   Not on file  Tobacco Use   Smoking status: Every Day    Current packs/day: 1.00    Average packs/day: 1 pack/day for 24.0 years (24.0 ttl pk-yrs)    Types: Cigarettes   Smokeless tobacco: Never   Tobacco comments:    smoking 1 PPD  Vaping Use   Vaping status: Never Used  Substance and Sexual Activity   Alcohol use: Not Currently    Alcohol/week: 0.0 standard drinks of alcohol   Drug use: No   Sexual activity: Yes    Partners: Female  Other Topics Concern   Not on file  Social History Narrative   Not on file   Social Drivers of Health   Financial Resource Strain: Not on file  Food Insecurity: No Food Insecurity (10/28/2023)   Hunger Vital Sign    Worried About Running Out of Food in the Last Year: Never true  Ran Out of Food in the Last Year: Never true  Transportation Needs: No Transportation Needs (10/28/2023)   PRAPARE - Administrator, Civil Service (Medical): No    Lack of Transportation (Non-Medical): No  Physical Activity: Not on file  Stress: Not on file  Social Connections: Not on file  Intimate Partner Violence: Not At Risk (10/28/2023)   Humiliation, Afraid, Rape, and Kick questionnaire    Fear of Current or Ex-Partner: No    Emotionally Abused: No    Physically Abused: No    Sexually Abused: No    Review of Systems: ROS negative except for what is noted on the assessment and plan.  Vitals:   11/18/23 0937  BP: (!) 149/91  Pulse: 89  Temp: 98.4 F (36.9 C)   TempSrc: Oral  SpO2: 98%  Weight: 279 lb 3.2 oz (126.6 kg)  Height: 5\' 10"  (1.778 m)    Physical Exam: Constitutional: well-appearing in no acute distress HENT: normocephalic atraumatic, mucous membranes moist Eyes: conjunctiva non-erythematous Neck: supple Cardiovascular: regular rate and rhythm, no m/r/g Pulmonary/Chest: normal work of breathing on room air, lungs clear to auscultation bilaterally Abdominal: soft, non-tender, non-distended MSK: normal bulk and tone Neurological: alert & oriented x 3, 5/5 strength in bilateral upper and lower extremities, normal gait Skin: warm and dry  Assessment & Plan:   Type II diabetes mellitus with ketoacidosis, uncontrolled (HCC) Patient was recently seen on 10/28/2023 for diabetes follow-up.  At that time, his A1c modestly improved from 14 to 12.2.  His medication regimen currently includes 55 units of glargine in the morning and 1000 mg of metformin twice daily.  At his last visit, he was started on Jardiance 25 mg daily. Medicaid now approved since his last visit so he is amendable to beginning a Dexcom.  Today, he forgot his glucose log but notes that it has been intermittent with some values in the 180s and some in the 230s.  Last night, his glucose was elevated at 455.  Today, will increase his glargine to 30 units twice daily.  Will also continue on his current regimen of 1000 mg metformin twice daily as patient denies any signs or symptoms.  Patient will begin Dexcom, and he met with Lupita Leash to further discuss appropriate glucose management.  Will follow-up in 2 weeks for further adjustments to his current regimen.  At this time, I would also follow-up with the lipid panel. - Begin glargine 30 units twice daily - Continue Jardiance 25 mg daily - Continue metformin 1000 mg twice daily  Essential hypertension Current medication regimen includes amlodipine 10 mg daily.  At his last visit, he had not been taking his medications.  His blood  pressure at that time was 167/102.  His repeat blood pressure was 170/113.  Today, he was counseled on logging his blood pressures at home.  His blood pressure in the office is 149/91.  He did not take his blood pressure medications this morning.  No changes to his regimen at this time - Continue amlodipine 10 mg daily and Jardiance 25 mg daily  Patient discussed with Dr. Susa Raring, MD  [facility] Internal Medicine, PGY-1 Date 11/18/2023 Time 11:56 AM

## 2023-11-18 NOTE — Progress Notes (Signed)
 Lab Results  Component Value Date   HGBA1C 12.2 (A) 10/28/2023   HGBA1C >14.0 (A) 01/29/2020   HGBA1C >14.0 (A) 10/11/2018   HGBA1C 7.2 (A) 02/25/2018   HGBA1C 14.3 (H) 11/28/2017    Diabetes Self-Management Education  Visit Type: First/Initial  Appt. Start Time: 1015 Appt. End Time: 1100  11/18/2023  [name], identified by name and date of birth, is a 48 y.o. male with a diagnosis of Diabetes: Type 2.   ASSESSMENT  Estimated body mass index is 40.06 kg/m as calculated from the following:   Height as of an earlier encounter on 11/18/23: 5\' 10"  (1.778 m).   Weight as of an earlier encounter on 11/18/23: 279 lb 3.2 oz (126.6 kg). Wt Readings from Last 10 Encounters:  11/18/23 279 lb 3.2 oz (126.6 kg)  10/28/23 281 lb 3.2 oz (127.6 kg)  01/29/20 238 lb 14.4 oz (108.4 kg)  10/11/18 266 lb 3.2 oz (120.7 kg)  02/25/18 (!) 322 lb 3.2 oz (146.1 kg)  12/22/17 (!) 316 lb 11.2 oz (143.7 kg)  12/06/17 (!) 321 lb 11.2 oz (145.9 kg)  11/28/17 300 lb (136.1 kg)  03/24/16 (!) 311 lb 14.4 oz (141.5 kg)  02/04/16 (!) 311 lb 6.4 oz (141.3 kg)      Diabetes Self-Management Education - 11/18/23 1100       Visit Information   Visit Type First/Initial      Initial Visit   Diabetes Type Type 2    Are you currently following a meal plan? Yes    What type of meal plan do you follow? HIs wife pansy says they eat meditteranean foods from where she works    Are you taking your medications as prescribed? Yes      Health Coping   How would you rate your overall health? Good      Psychosocial Assessment   Patient Belief/Attitude about Diabetes Other (comment)   frustrated as he does not think his current insulin regemin is working   What is the hardest part about your diabetes right now, causing you the most concern, or is the most worrisome to you about your diabetes?   Getting support / problem solving    Self-care barriers Lack of material resources    Self-management support  Family;Doctor's office;CDE visits    Other persons present Spouse/SO   Pansy who is very supportive   Patient Concerns Monitoring   wants to know how to use CGM that was prescribed   Special Needs None    Preferred Learning Style No preference indicated   he said he did not like echnology but did fine downloading dexcom G7 and moving around in the app   Learning Readiness Contemplating    How often do you need to have someone help you when you read instructions, pamphlets, or other written materials from your doctor or pharmacy? 2 - Rarely    What is the last grade level you completed in school? 12      Pre-Education Assessment   Patient understands the diabetes disease and treatment process. Comprehends key points    Patient understands incorporating nutritional management into lifestyle. Comprehends key points    Patient undertands incorporating physical activity into lifestyle. Comprehends key points    Patient understands using medications safely. Needs Review    Patient understands monitoring blood glucose, interpreting and using results Needs Review    Patient understands prevention, detection, and treatment of acute complications. Needs Review    Patient understands prevention,  detection, and treatment of chronic complications. Compreheands key points    Patient understands how to develop strategies to address psychosocial issues. Needs Review      Complications   Last HgB A1C per patient/outside source 12.2 %    How often do you check your blood sugar? 1-2 times/day    Fasting Blood glucose range (mg/dL) >161    Postprandial Blood glucose range (mg/dL) 096-045    Number of hypoglycemic episodes per month 0    Number of hyperglycemic episodes ( >200mg /dL): Daily    Can you tell when your blood sugar is high? Yes   frustrated that he is having high blood sugars   What do you do if your blood sugar is high? takes medicine    Have you had a dilated eye exam in the past 12 months? No     Have you had a dental exam in the past 12 months? Yes    Are you checking your feet? Yes    How many days per week are you checking your feet? 7      Dietary Intake   Dinner felafel, salad, gyros      Activity / Exercise   Activity / Exercise Type ADL's;Light (walking / raking leaves)      Patient Education   Previous Diabetes Education Yes (please comment)   here last time was 2020   Medications Reviewed patients medication for diabetes, action, purpose, timing of dose and side effects.    Monitoring Taught/evaluated CGM (comment)   gave him a free sample of dexcom G7 and educated he and his wife how to apply sensor and use app to self manage blood glucose   Acute complications Discussed and identified patients' prevention, symptoms, and treatment of hyperglycemia.   discussed how to prevent blood glucose from goign too high and what he can do to lower it using lifestyle choices.   Diabetes Stress and Support Identified and addressed patients feelings and concerns about diabetes;Worked with patient to identify barriers to care and solutions      Individualized Goals (developed by patient)   Monitoring  Consistenly use CGM      Post-Education Assessment   Patient understands using medications safely. Comphrehends key points    Patient understands monitoring blood glucose, interpreting and using results Comprehends key points    Patient understands prevention, detection, and treatment of acute complications. Comprehends key points      Outcomes   Expected Outcomes Demonstrated interest in learning. Expect positive outcomes    Future DMSE 2 wks    Program Status Not Completed             Individualized Plan for Diabetes Self-Management Training:   Learning Objective:  Patient will have a greater understanding of diabetes self-management. Patient education plan is to attend individual and/or group sessions per assessed needs and concerns.   Plan:   There are no Patient  Instructions on file for this visit.  Expected Outcomes:  Demonstrated interest in learning. Expect positive outcomes  Education material provided: Diabetes Resources  If problems or questions, patient to contact team via:  Phone and Email  Future DSME appointment: 2 wks Norm Parcel, RD 11/18/2023 11:29 AM.

## 2023-11-18 NOTE — Telephone Encounter (Signed)
 Prior Authorization for patient (Dexcom G7 Sensor/Dexcom G7 Receiver) came through from [place] has been submitted with last office notes and labs via fax [phone] Call [phone] [facility] will fax tomorrow)

## 2023-11-18 NOTE — Patient Instructions (Signed)
 Thank you so much for coming to the clinic today!   Please start taking Glargine 30 units two times per day (once in the morning and once in the evening)   We will follow-up in two weeks.   If you have any questions please feel free to the call the clinic at anytime at [phone]. It was a pleasure seeing you!  Best, Dr. Rayvon Char

## 2023-11-18 NOTE — Assessment & Plan Note (Signed)
 Patient was recently seen on 10/28/2023 for diabetes follow-up.  At that time, his A1c modestly improved from 14 to 12.2.  His medication regimen currently includes 55 units of glargine in the morning and 1000 mg of metformin twice daily.  At his last visit, he was started on Jardiance 25 mg daily. Medicaid now approved since his last visit so he is amendable to beginning a Dexcom.  Today, he forgot his glucose log but notes that it has been intermittent with some values in the 180s and some in the 230s.  Last night, his glucose was elevated at 455.  Today, will increase his glargine to 30 units twice daily.  Will also continue on his current regimen of 1000 mg metformin twice daily as patient denies any signs or symptoms.  Patient will begin Dexcom, and he met with Lupita Leash to further discuss appropriate glucose management.  Will follow-up in 2 weeks for further adjustments to his current regimen.  At this time, I would also follow-up with the lipid panel. - Begin glargine 30 units twice daily - Continue Jardiance 25 mg daily - Continue metformin 1000 mg twice daily

## 2023-11-19 ENCOUNTER — Other Ambulatory Visit (HOSPITAL_COMMUNITY): Payer: Self-pay

## 2023-11-19 NOTE — Progress Notes (Signed)
 Internal Medicine Clinic Attending  Case discussed with the resident at the time of the visit.  We reviewed the resident's history and exam and pertinent patient test results.  I agree with the assessment, diagnosis, and plan of care documented in the resident's note.

## 2023-11-22 ENCOUNTER — Other Ambulatory Visit (HOSPITAL_COMMUNITY): Payer: Self-pay

## 2023-11-22 NOTE — Telephone Encounter (Addendum)
 I called and spoke with Florentina Addison with the patients insurance pa has been approved effective until 11/13/2024. LK#[id number]

## 2023-11-24 ENCOUNTER — Telehealth: Payer: Self-pay | Admitting: Dietician

## 2023-11-24 ENCOUNTER — Other Ambulatory Visit (HOSPITAL_COMMUNITY): Payer: Self-pay

## 2023-11-24 NOTE — Telephone Encounter (Signed)
 [name] picked up sensors. States blood sugars high despite no food or drink today. He states his blood sugar goes down at work(at night), but then goes up and does not come down when he eats. He drinks a lot of water over the day, and regular soda with meals. Encouraged smaller portions of regular soda or alternatives. Follow up scheduled for Monday 4/21/after he sees the doctor.

## 2023-12-01 ENCOUNTER — Other Ambulatory Visit (HOSPITAL_COMMUNITY): Payer: Self-pay

## 2023-12-01 NOTE — Telephone Encounter (Signed)
 Patient has medicaid coverage.   No further action needed.

## 2023-12-06 ENCOUNTER — Other Ambulatory Visit (HOSPITAL_COMMUNITY): Payer: Self-pay

## 2023-12-06 ENCOUNTER — Encounter: Payer: Self-pay | Admitting: Dietician

## 2023-12-06 ENCOUNTER — Other Ambulatory Visit: Payer: Self-pay

## 2023-12-06 ENCOUNTER — Ambulatory Visit: Admitting: Internal Medicine

## 2023-12-06 VITALS — BP 151/94 | HR 85 | Temp 98.6°F | Ht 70.0 in | Wt 283.3 lb

## 2023-12-06 DIAGNOSIS — Z7984 Long term (current) use of oral hypoglycemic drugs: Secondary | ICD-10-CM

## 2023-12-06 DIAGNOSIS — E785 Hyperlipidemia, unspecified: Secondary | ICD-10-CM | POA: Diagnosis not present

## 2023-12-06 DIAGNOSIS — E1165 Type 2 diabetes mellitus with hyperglycemia: Secondary | ICD-10-CM | POA: Diagnosis present

## 2023-12-06 DIAGNOSIS — N529 Male erectile dysfunction, unspecified: Secondary | ICD-10-CM | POA: Diagnosis not present

## 2023-12-06 DIAGNOSIS — F17210 Nicotine dependence, cigarettes, uncomplicated: Secondary | ICD-10-CM | POA: Diagnosis not present

## 2023-12-06 DIAGNOSIS — Z794 Long term (current) use of insulin: Secondary | ICD-10-CM

## 2023-12-06 DIAGNOSIS — F17200 Nicotine dependence, unspecified, uncomplicated: Secondary | ICD-10-CM

## 2023-12-06 DIAGNOSIS — I10 Essential (primary) hypertension: Secondary | ICD-10-CM | POA: Diagnosis present

## 2023-12-06 MED ORDER — INSULIN GLARGINE 100 UNIT/ML SOLOSTAR PEN
32.0000 [IU] | PEN_INJECTOR | Freq: Two times a day (BID) | SUBCUTANEOUS | 3 refills | Status: DC
Start: 2023-12-06 — End: 2024-03-24
  Filled 2023-12-06: qty 15, 23d supply, fill #0
  Filled 2024-01-17 – 2024-02-29 (×2): qty 15, 23d supply, fill #1
  Filled 2024-03-22: qty 15, 23d supply, fill #2

## 2023-12-06 MED ORDER — EMPAGLIFLOZIN 10 MG PO TABS
10.0000 mg | ORAL_TABLET | Freq: Every day | ORAL | 2 refills | Status: DC
  Filled 2023-12-06 – 2023-12-16 (×2): qty 30, 30d supply, fill #0
  Filled 2024-01-17 – 2024-02-22 (×2): qty 30, 30d supply, fill #1
  Filled 2024-03-22: qty 30, 30d supply, fill #2

## 2023-12-06 MED ORDER — INSULIN LISPRO (1 UNIT DIAL) 100 UNIT/ML (KWIKPEN)
5.0000 [IU] | PEN_INJECTOR | Freq: Three times a day (TID) | SUBCUTANEOUS | 11 refills | Status: DC
  Filled 2023-12-06: qty 15, 90d supply, fill #0
  Filled 2024-01-17 – 2024-03-22 (×3): qty 15, 90d supply, fill #1

## 2023-12-06 MED ORDER — SILDENAFIL CITRATE 50 MG PO TABS
50.0000 mg | ORAL_TABLET | ORAL | 1 refills | Status: AC | PRN
  Filled 2023-12-06: qty 20, 20d supply, fill #0
  Filled 2024-01-17 – 2024-02-22 (×2): qty 20, 20d supply, fill #1

## 2023-12-06 MED ORDER — LOSARTAN POTASSIUM 25 MG PO TABS
25.0000 mg | ORAL_TABLET | Freq: Every day | ORAL | 11 refills | Status: DC
  Filled 2023-12-06: qty 30, 30d supply, fill #0
  Filled 2024-03-22: qty 30, 30d supply, fill #1

## 2023-12-06 NOTE — Assessment & Plan Note (Signed)
 Plan: Lipid panel today.

## 2023-12-06 NOTE — Progress Notes (Signed)
 CC: diabetes f/u  HPI:  WayneWayne Leonard is a 48 y.o. male with past medical history as detailed below who presents for diabetes f/u. Please see problem based charting for detailed assessment and plan.  Past Medical History:  Diagnosis Date   DKA (diabetic ketoacidoses) (HCC) 11/29/2017   Essential hypertension 11/23/2007   Hyperlipidemia 11/23/2007   Intermittent asthma, well controlled 11/23/2007   Obstructive sleep apnea 01/03/2008   Pancreatitis, alcoholic, acute 2001   Type 2 diabetes mellitus without complication, without long-term current use of insulin  (HCC) 09/24/2015   Review of Systems:  Negative unless otherwise stated.  Physical Exam:  Vitals:   12/06/23 0910 12/06/23 0927  BP: (!) 156/95 (!) 151/94  Pulse: 92 85  Temp: 98.6 F (37 C)   TempSrc: Oral   SpO2: 99%   Weight: 283 lb 4.8 oz (128.5 kg)   Height: 5\' 10"  (1.778 m)    Constitutional:Appears stated age, well. In no acute distress. Cardio:Regular rate and rhythm. No murmurs, rubs, or gallops. Pulm:Clear to auscultation bilaterally. Normal work of breathing on room air. ZOX:WRUEAVWU for extremity edema. Skin:Warm and dry. Neuro:Alert and oriented x3. No focal deficit noted. Psych:Pleasant mood and affect.  Assessment & Plan:   See Encounters Tab for problem based charting.  Uncontrolled type 2 diabetes mellitus with hyperglycemia (HCC) HbA1c last checked 10/2023 12.2%. OP regimen is jardiance  25 mg daily, metformin  1000 mg BID, lantus  30 units BID, however he ran out of jardiance  Friday as it was a sample amount that he was given. He has a Dexcom and review of this shows average glucose 248, in range 31%, above range 69%, below range 0%.   After meals sugar typically goes up to over 200, not over 300 though.  Urine microalbumin/creatinine ratio last checked 01/2023 was moderately elevated at 42. Plan: Continue metformin  1000 mg BID. Will send in prescription for jardiance  10 mg daily. Increase lantus  to  32 units BID. Start humalog  5 units TID with meals. Urine microalbumin/creatinine ratio today. Emphasized importance of annual diabetic eye exam. F/u in 2 weeks for BMP, further titration of insulin . Likely would benefit from starting GLP-1 in future.  Essential hypertension BP 156/95, on recheck 151/94. OP regimen is amlodipine  10 mg daily which he is compliant with but has not taken it today. BMP last checked 10/2023 abnormal renal function concerning for AKI versus now CKD stage 3a. Unfortunately does not appear that this was addressed further. He does not check his BP at home. Plan:Continue amlodipine  10 mg daily. Will start losartan  25 mg daily. F/u in 4 weeks for BP recheck. Will repeat BMP today. Will check UA, consider renal ultrasound for further evaluation pending these results. Suspect that in light of HTN, diabetes, he has progressed to CKD.  Tobacco use disorder Patient counseled on smoking cessation today.  Hyperlipidemia Plan: Lipid panel today.  Erectile dysfunction Patient describes concerns regarding ability to maintain erection and resulting inability to engage in intercourse. This has been a problem since being released from prison back in March. He denies depression but does have some stress in his life as he gets back to a more normal life for him since being released from prison. He still has morning erections, nocturnal erections, spontaneous erections. He denies libido troubles. His exercise tolerance isn't great. He smokes and has additional comorbid conditions of diabetes and hypertension, both of which are uncontrolled. No statin therapy listed despite history of hyperlipidemia on chart. Plan: Will check lipid panel today and treat  accordingly. Patient advised to stop smoking. Will work on better control of HTN, diabetes. I have explained to Wayne Leonard that I am worried about his overall cardiovascular health given ED and comorbid conditions, and that treating his ED will  require better control of these conditions. Sildenafil  50 mg as needed sent in with return precautions addressed. Suspect he may benefit from further cardiovascular work up in future such as coronary artery calcium  scoring, possibly cardiology.  Patient discussed with Dr. Lanetta Pion

## 2023-12-06 NOTE — Assessment & Plan Note (Addendum)
 Patient describes concerns regarding ability to maintain erection and resulting inability to engage in intercourse. This has been a problem since being released from prison back in March. He denies depression but does have some stress in his life as he gets back to a more normal life for him since being released from prison. He still has morning erections, nocturnal erections, spontaneous erections. He denies libido troubles. His exercise tolerance isn't great. He smokes and has additional comorbid conditions of diabetes and hypertension, both of which are uncontrolled. No statin therapy listed despite history of hyperlipidemia on chart. Plan: Will check lipid panel today and treat accordingly. Patient advised to stop smoking. Will work on better control of HTN, diabetes. I have explained to Wayne Leonard that I am worried about his overall cardiovascular health given ED and comorbid conditions, and that treating his ED will require better control of these conditions. Sildenafil  50 mg as needed sent in with return precautions addressed. Suspect he may benefit from further cardiovascular work up in future such as coronary artery calcium  scoring, possibly cardiology.

## 2023-12-06 NOTE — Patient Instructions (Addendum)
 Mr. Correia,  It was a pleasure to care for you today!  I am increasing your lantus  to 32 units twice daily and I would like you to start taking humalog  5 units three times daily before meals. Continue metformin . I have sent in a prescription for jardiance  as well.   For your blood pressure please start checking this at home and keep a log of the recordings several times per week. I have also started a medicine called losartan  that you will take daily. Please follow up in about 2 weeks for blood pressure recheck.  I have sent in sildenafil  to help with erectile dysfunction. Note, if you have an erection that lasts for than 4 hours please call the clinic or go to the emergency room.  My best, Dr. Rozelle Corning

## 2023-12-06 NOTE — Assessment & Plan Note (Signed)
 Patient counseled on smoking cessation today.

## 2023-12-06 NOTE — Assessment & Plan Note (Signed)
 BP 156/95, on recheck 151/94. OP regimen is amlodipine  10 mg daily which he is compliant with but has not taken it today. BMP last checked 10/2023 abnormal renal function concerning for AKI versus now CKD stage 3a. Unfortunately does not appear that this was addressed further. He does not check his BP at home. Plan:Continue amlodipine  10 mg daily. Will start losartan  25 mg daily. F/u in 4 weeks for BP recheck. Will repeat BMP today. Will check UA, consider renal ultrasound for further evaluation pending these results. Suspect that in light of HTN, diabetes, he has progressed to CKD.

## 2023-12-06 NOTE — Assessment & Plan Note (Addendum)
 HbA1c last checked 10/2023 12.2%. OP regimen is jardiance  25 mg daily, metformin  1000 mg BID, lantus  30 units BID, however he ran out of jardiance  Friday as it was a sample amount that he was given. He has a Dexcom and review of this shows average glucose 248, in range 31%, above range 69%, below range 0%.   After meals sugar typically goes up to over 200, not over 300 though.  Urine microalbumin/creatinine ratio last checked 01/2023 was moderately elevated at 42. Plan: Continue metformin  1000 mg BID. Will send in prescription for jardiance  10 mg daily. Increase lantus  to 32 units BID. Start humalog  5 units TID with meals. Urine microalbumin/creatinine ratio today. Emphasized importance of annual diabetic eye exam. F/u in 2 weeks for BMP, further titration of insulin . Likely would benefit from starting GLP-1 in future.

## 2023-12-07 ENCOUNTER — Telehealth: Payer: Self-pay

## 2023-12-07 LAB — BMP8+ANION GAP
Anion Gap: 12 mmol/L (ref 10.0–18.0)
BUN/Creatinine Ratio: 8 — ABNORMAL LOW (ref 9–20)
BUN: 13 mg/dL (ref 6–24)
CO2: 23 mmol/L (ref 20–29)
Calcium: 9.3 mg/dL (ref 8.7–10.2)
Chloride: 105 mmol/L (ref 96–106)
Creatinine, Ser: 1.65 mg/dL — ABNORMAL HIGH (ref 0.76–1.27)
Glucose: 160 mg/dL — ABNORMAL HIGH (ref 70–99)
Potassium: 4.4 mmol/L (ref 3.5–5.2)
Sodium: 140 mmol/L (ref 134–144)
eGFR: 51 mL/min/{1.73_m2} — ABNORMAL LOW (ref >59)

## 2023-12-07 LAB — URINALYSIS, ROUTINE W REFLEX MICROSCOPIC
Bilirubin, UA: NEGATIVE
Ketones, UA: NEGATIVE
Leukocytes,UA: NEGATIVE
Nitrite, UA: NEGATIVE
Protein,UA: NEGATIVE
RBC, UA: NEGATIVE
Specific Gravity, UA: 1.024 (ref 1.005–1.030)
Urobilinogen, Ur: 0.2 mg/dL (ref 0.2–1.0)
pH, UA: 5 (ref 5.0–7.5)

## 2023-12-07 LAB — MICROALBUMIN / CREATININE URINE RATIO
Creatinine, Urine: 126.5 mg/dL
Microalb/Creat Ratio: 26 mg/g{creat} (ref 0–29)
Microalbumin, Urine: 32.4 ug/mL

## 2023-12-07 LAB — LIPID PANEL
Chol/HDL Ratio: 3.5 ratio (ref 0.0–5.0)
Cholesterol, Total: 123 mg/dL (ref 100–199)
HDL: 35 mg/dL — ABNORMAL LOW (ref >39)
LDL Chol Calc (NIH): 70 mg/dL (ref 0–99)
Triglycerides: 95 mg/dL (ref 0–149)
VLDL Cholesterol Cal: 18 mg/dL (ref 5–40)

## 2023-12-07 NOTE — Telephone Encounter (Signed)
 Prior Authorization for patient (Jardiance  10MG  tablets) came through on cover my meds was submitted with last office notes and labs awaiting approval or denial.  KEY:[id number]  Phone: [phone] Fax: [phone]

## 2023-12-10 NOTE — Telephone Encounter (Signed)
 I called and spoke to [name] with the patients insurance the claim is approved effective 12/07/23-12/01/2024. PA #[id number]

## 2023-12-14 NOTE — Progress Notes (Signed)
 Internal Medicine Clinic Attending  Case discussed with the resident at the time of the visit.  We reviewed the resident's history and exam and pertinent patient test results.  I agree with the assessment, diagnosis, and plan of care documented in the resident's note.

## 2023-12-16 ENCOUNTER — Other Ambulatory Visit (HOSPITAL_COMMUNITY): Payer: Self-pay

## 2023-12-16 ENCOUNTER — Telehealth: Payer: Self-pay | Admitting: *Deleted

## 2023-12-16 NOTE — Telephone Encounter (Signed)
 Call to pharmacy PA was approval was seen.  Will prepare Jardiance  for patient to pick up.  Patient also asked about his Sensors was unable to get earlier this week.  Pharmacy stated would have for patient to pick up today. Copied from CRM [id number]. Topic: Clinical - Prescription Issue >> Dec 16, 2023 10:29 AM Danelle Dunning F wrote: Reason for CRM:   Patient called in stating that he missed a call from the office; Agent confirmed it may have been for his appointment scheduled for tomorrow; In the midst of reviewing the patient's chart the agent inquired about the patient's empagliflozin  (JARDIANCE ) 10 MG TABS tablet and whether he was aware of the approval of its PA. Patient was unaware and stated he never received the prescription. Please resubmit prescription to the pharmacy listed below. Please call patient once it has been sent over.  Phone: [phone]  [facility] [address] [place] [address] Phone: [phone] Fax: [phone] Hours: M-F 7:30am-6pm

## 2023-12-17 ENCOUNTER — Other Ambulatory Visit: Payer: Self-pay | Admitting: Internal Medicine

## 2023-12-17 ENCOUNTER — Encounter: Admitting: Internal Medicine

## 2023-12-17 DIAGNOSIS — I10 Essential (primary) hypertension: Secondary | ICD-10-CM

## 2023-12-17 NOTE — Progress Notes (Deleted)
  T2DM HbA1c last checked 10/2023 12.2%, not yet due for repeat. OP regimen is metformin  1000 mg BID, lantus  32 units BID, humalog  5 units TID with meals, jardiance  10 mg daily. He does*** check his blood sugar at home and ***. Last eye exam ***. Plan:  Emphasized importance of annual diabetic eye exam.   HTN BP ***. OP regimen is amlodipine  10 mg daily, losartan  25 mg daily which he is*** compliant with. BMP last checked consistent with CKD stage 3a.  Plan:Repeat BMP today.  CKD3A New diagnosis. UA unremarkable 2 weeks ago. He is now consistently on jardiance  10 mg daily, but suspect he would benefit from addition of GLP-1 in future.*** Plan: Will order renal ultrasound.   HLD LDL at goal, 70. He is*** on a statin: ***. Plan:  HCM: HCV Tetanus Pneumonia  colon

## 2023-12-21 ENCOUNTER — Encounter: Admitting: Student

## 2024-01-12 ENCOUNTER — Telehealth: Payer: Self-pay

## 2024-01-12 NOTE — Telephone Encounter (Signed)
 Copied from CRM [id number]. Topic: General - Other >> Jan 11, 2024  1:49 PM Corin V wrote: Reason for CRM: Patient is needing the NPI number for his PCP to provide to Medicaid. They also told him they need a locator number as well. Please call patient back with this information.

## 2024-01-12 NOTE — Telephone Encounter (Signed)
 I contacted pt back regarding information that is needed for his insurance. Pt states he already gave them the doctor and the office name, but it's still states out of network. Advised pt to contact them back and have medicaid office to call [facility] clinic, so we can verify that we do accept medicaid.

## 2024-01-12 NOTE — Telephone Encounter (Signed)
 Called and spoke with the patient and provided a Faculty's name to give to his Case worker.   Copied from CRM [id number]. Topic: General - Other >> Jan 12, 2024 10:48 AM Adrianna P wrote: Reason for CRM: patient is having issues with his medicaid they are saying this office is not in network with his insurance even though he has been here several times. Patient would like help navigating through this, he does not want a new Dr. Please call [phone]

## 2024-01-17 ENCOUNTER — Other Ambulatory Visit (HOSPITAL_COMMUNITY): Payer: Self-pay

## 2024-01-17 ENCOUNTER — Other Ambulatory Visit: Payer: Self-pay

## 2024-01-24 ENCOUNTER — Encounter: Payer: Self-pay | Admitting: Student

## 2024-01-27 ENCOUNTER — Other Ambulatory Visit (HOSPITAL_COMMUNITY): Payer: Self-pay

## 2024-01-28 ENCOUNTER — Other Ambulatory Visit (HOSPITAL_COMMUNITY): Payer: Self-pay

## 2024-02-22 ENCOUNTER — Other Ambulatory Visit (HOSPITAL_COMMUNITY): Payer: Self-pay

## 2024-02-22 ENCOUNTER — Other Ambulatory Visit: Payer: Self-pay

## 2024-02-29 ENCOUNTER — Other Ambulatory Visit (HOSPITAL_COMMUNITY): Payer: Self-pay

## 2024-03-22 ENCOUNTER — Other Ambulatory Visit: Payer: Self-pay

## 2024-03-22 ENCOUNTER — Other Ambulatory Visit: Payer: Self-pay | Admitting: Student

## 2024-03-22 ENCOUNTER — Other Ambulatory Visit (HOSPITAL_COMMUNITY): Payer: Self-pay

## 2024-03-22 DIAGNOSIS — E1165 Type 2 diabetes mellitus with hyperglycemia: Secondary | ICD-10-CM

## 2024-03-22 MED ORDER — DEXCOM G7 SENSOR MISC
3 refills | Status: DC
  Filled 2024-03-22: qty 3, 30d supply, fill #0
  Filled 2024-04-24: qty 3, 30d supply, fill #1

## 2024-03-22 NOTE — Telephone Encounter (Signed)
 Medication sent to pharmacy

## 2024-03-23 ENCOUNTER — Encounter: Admitting: Student

## 2024-03-24 ENCOUNTER — Other Ambulatory Visit (HOSPITAL_COMMUNITY): Payer: Self-pay

## 2024-03-24 ENCOUNTER — Other Ambulatory Visit: Payer: Self-pay

## 2024-03-24 ENCOUNTER — Ambulatory Visit: Admitting: Student

## 2024-03-24 VITALS — BP 163/88 | HR 75 | Temp 97.9°F | Ht 70.0 in | Wt 277.6 lb

## 2024-03-24 DIAGNOSIS — I10 Essential (primary) hypertension: Secondary | ICD-10-CM

## 2024-03-24 DIAGNOSIS — Z7985 Long-term (current) use of injectable non-insulin antidiabetic drugs: Secondary | ICD-10-CM | POA: Diagnosis not present

## 2024-03-24 DIAGNOSIS — Z7984 Long term (current) use of oral hypoglycemic drugs: Secondary | ICD-10-CM | POA: Diagnosis not present

## 2024-03-24 DIAGNOSIS — Z794 Long term (current) use of insulin: Secondary | ICD-10-CM | POA: Diagnosis not present

## 2024-03-24 DIAGNOSIS — E1110 Type 2 diabetes mellitus with ketoacidosis without coma: Secondary | ICD-10-CM

## 2024-03-24 DIAGNOSIS — E1165 Type 2 diabetes mellitus with hyperglycemia: Secondary | ICD-10-CM | POA: Diagnosis present

## 2024-03-24 LAB — POCT GLYCOSYLATED HEMOGLOBIN (HGB A1C): Hemoglobin A1C: 11.7 % — AB (ref 4.0–5.6)

## 2024-03-24 LAB — GLUCOSE, CAPILLARY: Glucose-Capillary: 185 mg/dL — ABNORMAL HIGH (ref 70–99)

## 2024-03-24 MED ORDER — AMLODIPINE BESYLATE 10 MG PO TABS
10.0000 mg | ORAL_TABLET | Freq: Every day | ORAL | 6 refills | Status: DC
  Filled 2024-03-24: qty 30, 30d supply, fill #0
  Filled 2024-04-24: qty 30, 30d supply, fill #1

## 2024-03-24 MED ORDER — EMPAGLIFLOZIN 10 MG PO TABS
10.0000 mg | ORAL_TABLET | Freq: Every day | ORAL | 6 refills | Status: DC
  Filled 2024-04-24: qty 30, 30d supply, fill #0

## 2024-03-24 MED ORDER — SEMAGLUTIDE(0.25 OR 0.5MG/DOS) 2 MG/3ML ~~LOC~~ SOPN
0.2500 mg | PEN_INJECTOR | SUBCUTANEOUS | 0 refills | Status: DC
  Filled 2024-03-24: qty 3, 56d supply, fill #0

## 2024-03-24 MED ORDER — INSULIN GLARGINE 100 UNIT/ML SOLOSTAR PEN
36.0000 [IU] | PEN_INJECTOR | Freq: Two times a day (BID) | SUBCUTANEOUS | 3 refills | Status: DC
  Filled 2024-03-24 (×2): qty 15, 21d supply, fill #0
  Filled 2024-04-24: qty 15, 21d supply, fill #1

## 2024-03-24 MED ORDER — LOSARTAN POTASSIUM 25 MG PO TABS: 25.0000 mg | ORAL_TABLET | Freq: Every day | ORAL | 6 refills | Status: DC

## 2024-03-24 MED ORDER — METFORMIN HCL 500 MG PO TABS
1000.0000 mg | ORAL_TABLET | Freq: Two times a day (BID) | ORAL | 6 refills | Status: AC
Start: 2024-03-24 — End: ?
  Filled 2024-03-24: qty 120, 30d supply, fill #0
  Filled 2024-04-24: qty 120, 30d supply, fill #1

## 2024-03-24 NOTE — Assessment & Plan Note (Addendum)
 BP elevated due to nonadherence. Currently on amlodipine  10 mg and losartan  25 mg. Denies chest pain or palpitations. - - Will check BMP, - reinforced adherence strategies (including pillbox use), continue current regimen, and follow up in 4 weeks.

## 2024-03-24 NOTE — Progress Notes (Signed)
 CC: Follow-up on chronic conditions  HPI:  [name] is a 48 y.o. male living with a history stated below and presents today for follow-up on chronic medical conditions..    Please see problem based assessment and plan for additional details.  Past Medical History:  Diagnosis Date   DKA (diabetic ketoacidoses) (HCC) 11/29/2017   Essential hypertension 11/23/2007   Hyperlipidemia 11/23/2007   Intermittent asthma, well controlled 11/23/2007   Obstructive sleep apnea 01/03/2008   Pancreatitis, alcoholic, acute 2001   Type 2 diabetes mellitus without complication, without long-term current use of insulin  (HCC) 09/24/2015    Current Outpatient Medications on File Prior to Visit  Medication Sig Dispense Refill   albuterol  (PROVENTIL  HFA;VENTOLIN  HFA) 108 (90 Base) MCG/ACT inhaler Inhale 2 puffs into the lungs every 6 (six) hours as needed for wheezing or shortness of breath. 1 Inhaler 6   blood glucose meter kit and supplies KIT Dispense based on patient and insurance preference. Use up to four times daily as directed. (FOR ICD-9 250.00, 250.01). 1 each 0   Blood Glucose Monitoring Suppl (BLOOD GLUCOSE MONITOR SYSTEM) w/Device KIT Use to check blood glucose in the morning, at noon, and at bedtime. 1 kit 0   Continuous Glucose Receiver (DEXCOM G7 RECEIVER) DEVI Use as directed with Dexcom sensor. 1 each 1   Continuous Glucose Sensor (DEXCOM G7 SENSOR) MISC Replace sensor every 10 days. 3 each 3   glucose blood (TRUE METRIX BLOOD GLUCOSE TEST) test strip Check blood sugars 3 (three) times daily. 100 each 12   Insulin  Pen Needle 32G X 4 MM MISC Use as directed. 200 each 11   Insulin  Syringe-Needle U-100 (INSULIN  SYRINGE 1CC/31GX5/16) 31G X 5/16 1 ML MISC Use once per day with insulin  100 each 0   sildenafil  (VIAGRA ) 50 MG tablet Take 1 tablet (50 mg total) by mouth as needed for erectile dysfunction. 20 tablet 1   TRUEplus Lancets 33G MISC Use as instructed to monitor glucose three times  daily 100 each 12   No current facility-administered medications on file prior to visit.    Review of Systems: ROS negative except for what is noted on the assessment and plan.  Vitals:   03/24/24 0934 03/24/24 0940  BP: (!) 172/96 (!) 163/88  Pulse: 77 75  Temp: 97.9 F (36.6 C)   TempSrc: Oral   SpO2: 100%   Weight: 277 lb 9.6 oz (125.9 kg)   Height: 5' 10 (1.778 m)    Physical Exam: Constitutional: NAD Cardiovascular: RRR, no murmurs. Pulmonary/Chest: Clear bilateral lungs Abdominal: Soft, obese abdomen.  Normal bowel sounds.  Assessment & Plan:   Patient discussed with Dr. Karna  Assessment & Plan Uncontrolled type 2 diabetes mellitus with hyperglycemia (HCC) A1c improved from 12.2 to 11.7. Current regimen: Jardiance  10 mg daily, metformin  1000 mg BID, Lantus  30 U BID, Humalog  5 U TID. Patient has been taking Lantus  once daily due to work schedule (5 PM-2 AM) and is intermittently noncompliant with metformin  but consistent with Jardiance . Reports polyuria and polydipsia. Dexcom shows glucose in target 16% of the time, average 270 mg/dL. Unable to adhere to BID basal and mealtime insulin , so regimen will be simplified.  Plan: - A1c in 3 months - Discontinue insulin  with meals - Start Ozempic  0.25 mg weekly  4 weeks, then increase to 0.5 mg if tolerated; - Start 36 units of Lantus  daily. - Continue metformin  1000 mg twice daily - Continue with Jardiance  10 mg daily. - Follow-up in 4  weeks. - referral to [facility] for medication management  Essential hypertension BP elevated due to nonadherence. Currently on amlodipine  10 mg and losartan  25 mg. Denies chest pain or palpitations. - - Will check BMP, - reinforced adherence strategies (including pillbox use), continue current regimen, and follow up in 4 weeks. Type II diabetes mellitus with ketoacidosis, uncontrolled (HCC)   Orders Placed This Encounter  Procedures   Glucose, capillary   BMP8   Lipid Profile   AMB  Referral [facility] Care Management   POC Hbg A1C    Wayne Sandhoff, MD [facility] Internal Medicine, PGY-2  Date 03/27/2024 Time 7:42 AM

## 2024-03-24 NOTE — Patient Instructions (Addendum)
 It was a pleasure taking care of you today!      Your diabetes is uncontrolled.  I have made changes to your regimen. I have increased insulin  to 36 units once daily. Take 2 tablets of metformin  500 mg twice a day. Start Ozempic  0.25 mg weekly injection.  If you are tolerating without any side effect, would increase the dose to 0.5 mg every 4 weeks.   2.  For your hypertension, please take amlodipine  10 mg daily and losartan  25 mg daily.  3.  I will check lab, and I will call you with the results of the lab.  I have ordered the following labs for you:  Lab Orders         Glucose, capillary         BMP8         Lipid Profile         POC Hbg A1C       Follow up: 4 weeks    Should you have any questions or concerns please call the internal medicine clinic at [phone].     Missy Sandhoff, MD  [facility]

## 2024-03-24 NOTE — Assessment & Plan Note (Addendum)
 A1c improved from 12.2 to 11.7. Current regimen: Jardiance  10 mg daily, metformin  1000 mg BID, Lantus  30 U BID, Humalog  5 U TID. Patient has been taking Lantus  once daily due to work schedule (5 PM-2 AM) and is intermittently noncompliant with metformin  but consistent with Jardiance . Reports polyuria and polydipsia. Dexcom shows glucose in target 16% of the time, average 270 mg/dL. Unable to adhere to BID basal and mealtime insulin , so regimen will be simplified.  Plan: - A1c in 3 months - Discontinue insulin  with meals - Start Ozempic  0.25 mg weekly  4 weeks, then increase to 0.5 mg if tolerated; - Start 36 units of Lantus  daily. - Continue metformin  1000 mg twice daily - Continue with Jardiance  10 mg daily. - Follow-up in 4 weeks. - referral to [facility] for medication management

## 2024-03-25 LAB — LIPID PANEL
Chol/HDL Ratio: 6.6 ratio — ABNORMAL HIGH (ref 0.0–5.0)
Cholesterol, Total: 218 mg/dL — ABNORMAL HIGH (ref 100–199)
HDL: 33 mg/dL — ABNORMAL LOW (ref >39)
LDL Chol Calc (NIH): 129 mg/dL — ABNORMAL HIGH (ref 0–99)
Triglycerides: 314 mg/dL — ABNORMAL HIGH (ref 0–149)
VLDL Cholesterol Cal: 56 mg/dL — ABNORMAL HIGH (ref 5–40)

## 2024-03-25 LAB — BMP8
BUN: 16 mg/dL (ref 6–24)
CO2: 20 mmol/L (ref 20–29)
Calcium: 9.3 mg/dL (ref 8.7–10.2)
Chloride: 104 mmol/L (ref 96–106)
Creatinine, Ser: 2.03 mg/dL — ABNORMAL HIGH (ref 0.76–1.27)
Glucose: 191 mg/dL — ABNORMAL HIGH (ref 70–99)
Potassium: 4 mmol/L (ref 3.5–5.2)
Sodium: 140 mmol/L (ref 134–144)
eGFR: 40 mL/min/1.73 — ABNORMAL LOW (ref >59)

## 2024-03-27 ENCOUNTER — Other Ambulatory Visit (HOSPITAL_COMMUNITY): Payer: Self-pay

## 2024-03-27 ENCOUNTER — Other Ambulatory Visit: Payer: Self-pay | Admitting: Student

## 2024-03-27 ENCOUNTER — Encounter: Payer: Self-pay | Admitting: Student

## 2024-03-27 ENCOUNTER — Other Ambulatory Visit: Payer: Self-pay

## 2024-03-27 ENCOUNTER — Ambulatory Visit: Payer: Self-pay | Admitting: Student

## 2024-03-27 DIAGNOSIS — E785 Hyperlipidemia, unspecified: Secondary | ICD-10-CM

## 2024-03-27 DIAGNOSIS — N179 Acute kidney failure, unspecified: Secondary | ICD-10-CM

## 2024-03-27 MED ORDER — ATORVASTATIN CALCIUM 40 MG PO TABS
40.0000 mg | ORAL_TABLET | Freq: Every day | ORAL | 11 refills | Status: DC
  Filled 2024-03-27 – 2024-04-24 (×3): qty 30, 30d supply, fill #0

## 2024-03-27 NOTE — Progress Notes (Signed)
 BMP revealed AKI with SCr of 2.03 (baseline over the past 6 months: 1.65-1.80). Will hold amlodipine , losartan , and Jardiance , and metformin .  A lab-only visit for BMP has been scheduled for 8/14; patient notified.

## 2024-03-27 NOTE — Progress Notes (Signed)
 History of diabetes, LDL elevated, ASCVD risk of 15%, will start him on high intensity statin.  Patient notified.

## 2024-03-27 NOTE — Progress Notes (Signed)
 Called patient to review labs. Advised holding both antihypertensives (amlodipine  and losartan ) and Jardiance  10 mg due to AKI. He has been scheduled a lab-only visit on 8/14 for BMP recheck.   LDL was elevated; initiated atorvastatin  40 mg daily

## 2024-03-28 NOTE — Progress Notes (Signed)
 Internal Medicine Clinic Attending  Case discussed with the resident at the time of the visit.  We reviewed the resident's history and exam and pertinent patient test results.  I agree with the assessment, diagnosis, and plan of care documented in the resident's note.

## 2024-03-31 ENCOUNTER — Other Ambulatory Visit

## 2024-03-31 ENCOUNTER — Other Ambulatory Visit (HOSPITAL_COMMUNITY): Payer: Self-pay

## 2024-03-31 DIAGNOSIS — N179 Acute kidney failure, unspecified: Secondary | ICD-10-CM | POA: Diagnosis present

## 2024-04-01 LAB — BMP8
BUN: 15 mg/dL (ref 6–24)
CO2: 17 mmol/L — ABNORMAL LOW (ref 20–29)
Calcium: 8.9 mg/dL (ref 8.7–10.2)
Chloride: 105 mmol/L (ref 96–106)
Creatinine, Ser: 1.66 mg/dL — ABNORMAL HIGH (ref 0.76–1.27)
Glucose: 213 mg/dL — ABNORMAL HIGH (ref 70–99)
Potassium: 4 mmol/L (ref 3.5–5.2)
Sodium: 137 mmol/L (ref 134–144)
eGFR: 51 mL/min/1.73 — ABNORMAL LOW (ref >59)

## 2024-04-03 ENCOUNTER — Ambulatory Visit: Payer: Self-pay | Admitting: Student

## 2024-04-03 NOTE — Progress Notes (Signed)
 BMP with stable SCr and serum electrolytes.

## 2024-04-04 ENCOUNTER — Telehealth: Payer: Self-pay | Admitting: Student

## 2024-04-04 NOTE — Telephone Encounter (Signed)
 Discussed with patient that Scr has improved per recent labs. Advised resumption of Losartan , Metformin , and Jardiance . Patient remains on Amlodipine  as previously instructed. He endorsed understanding of  what was discussed.  Delayed documentation due to oversight, this information was relayed to patient on 8/18.

## 2024-04-05 ENCOUNTER — Other Ambulatory Visit (HOSPITAL_COMMUNITY): Payer: Self-pay

## 2024-04-14 ENCOUNTER — Telehealth: Payer: Self-pay | Admitting: *Deleted

## 2024-04-14 NOTE — Progress Notes (Signed)
 Care Guide Pharmacy Note  04/14/2024 Name: Wayne Leonard MRN: [id number] DOB: [birth year]  Referred By: Celestina Czar, MD Reason for referral: Complex Care Management (Initial outreach to schedule referral with PharmD)   Wayne Leonard is a 48 y.o. year old male who is a primary care patient of Koomson, Czar, MD.  Wayne Leonard was referred to the pharmacist for assistance related to: DMII  Successful contact was made with the patient to discuss pharmacy services including being ready for the pharmacist to call at least 5 minutes before the scheduled appointment time and to have medication bottles and any blood pressure readings ready for review. The patient agreed to meet with the pharmacist via telephone visit on (date/time).04/27/24 at 200 PM  Wayne Leonard  [facility] Guide  Direct Dial : [phone]  Fax [phone]

## 2024-04-24 ENCOUNTER — Other Ambulatory Visit (HOSPITAL_COMMUNITY): Payer: Self-pay

## 2024-04-24 ENCOUNTER — Other Ambulatory Visit: Payer: Self-pay

## 2024-04-27 ENCOUNTER — Other Ambulatory Visit (HOSPITAL_COMMUNITY): Payer: Self-pay

## 2024-04-27 ENCOUNTER — Other Ambulatory Visit

## 2024-04-27 DIAGNOSIS — I10 Essential (primary) hypertension: Secondary | ICD-10-CM

## 2024-04-27 DIAGNOSIS — E785 Hyperlipidemia, unspecified: Secondary | ICD-10-CM

## 2024-04-27 DIAGNOSIS — E1165 Type 2 diabetes mellitus with hyperglycemia: Secondary | ICD-10-CM

## 2024-04-27 MED ORDER — DEXCOM G7 SENSOR MISC
3 refills | Status: AC
  Filled 2024-04-27 – 2024-09-19 (×4): qty 9, 90d supply, fill #0

## 2024-04-27 MED ORDER — METFORMIN HCL ER 500 MG PO TB24
500.0000 mg | ORAL_TABLET | Freq: Two times a day (BID) | ORAL | 3 refills | Status: AC
  Filled 2024-04-27 – 2024-09-19 (×4): qty 180, 90d supply, fill #0

## 2024-04-27 MED ORDER — EMPAGLIFLOZIN 10 MG PO TABS
10.0000 mg | ORAL_TABLET | Freq: Every day | ORAL | 1 refills | Status: AC
  Filled 2024-04-27 – 2024-09-19 (×4): qty 90, 90d supply, fill #0

## 2024-04-27 MED ORDER — INSULIN GLARGINE 100 UNIT/ML SOLOSTAR PEN
36.0000 [IU] | PEN_INJECTOR | Freq: Every day | SUBCUTANEOUS | 3 refills | Status: AC
  Filled 2024-04-27 – 2024-09-19 (×5): qty 30, 83d supply, fill #0

## 2024-04-27 MED ORDER — AMLODIPINE BESYLATE 10 MG PO TABS
10.0000 mg | ORAL_TABLET | Freq: Every day | ORAL | 3 refills | Status: AC
  Filled 2024-04-27 – 2024-09-19 (×4): qty 90, 90d supply, fill #0

## 2024-04-27 MED ORDER — ATORVASTATIN CALCIUM 40 MG PO TABS
40.0000 mg | ORAL_TABLET | Freq: Every day | ORAL | 3 refills | Status: AC
  Filled 2024-04-27 – 2024-09-19 (×4): qty 90, 90d supply, fill #0

## 2024-04-27 MED ORDER — SEMAGLUTIDE(0.25 OR 0.5MG/DOS) 2 MG/3ML ~~LOC~~ SOPN
0.5000 mg | PEN_INJECTOR | SUBCUTANEOUS | 1 refills | Status: DC
  Filled 2024-04-27: qty 3, fill #0
  Filled 2024-05-18: qty 3, 28d supply, fill #0
  Filled 2024-07-17: qty 3, 28d supply, fill #1
  Filled 2024-08-21: qty 3, 28d supply, fill #0

## 2024-04-27 NOTE — Progress Notes (Signed)
 04/27/2024 Name: Wayne Leonard MRN: [id number] DOB: 25-Apr-1976  Chief Complaint  Patient presents with   Diabetes   Hypertension   Hyperlipidemia    Wayne Leonard is a 48 y.o. year old male who presented for a telephone visit.   They were referred to the pharmacist by their PCP for assistance in managing diabetes. PMH includes HTN, OSA, T2DM, HLD, CKD3a, tobacco use, BMI > 30.    Subjective: Patient was last seen by PCP, Wayne Sandhoff, MD, on 03/24/24. At last visit, BP was elevated to 163/88 mmHg, HR 75 bpm. A1C had improved from 12.2% to 11.7%. Patient was prescribed Lantus  30 units BID but reported only taking 30 units once daily due to his work schedule (5PM-2AM) and was intermittently noncompliant with metformin . Reported polyuria and polydipsia. TIR was only 16% with average BG of 270 mg/dL. He was instructed to start Ozempic  0.25 mg weekly, take Lantus  36 units once daily, continue metformin  1000 mg BID, and continue Jardiance  10 mg daily. He was also instructed to start atorvastatin  40 mg daily. However, his BMP returned with evidence of an AKI, (Scr 1.6 > 2.03 mg/dL) and he was instructed to hold amlodipine , losartan , Jardiance , and metformin . Repeat BMP demonstrated Scr returned to 1.6 mg/dL.   Today, patient reports doing well. He reports he has been taking Ozempic  0.25 mg weekly, but upon further investigation he has only been dialing up to the first dash on the pen, rather than scrolling all the way up to 0.25 mg - so he has not actually taken any substantial amount of Ozempic  yet. He is taking metformin  at a reduced dose of 1 tab (500 mg) BID. He reports that he ran out of Jardiance  yesterday and needs to pick it up, along with atorvastatin . He has been taking Lantus  36 units once daily   Care Team: Primary Care Provider: Sandhoff Missy, MD ; Next Scheduled Visit: Needs to be scheduled ~ Nov 2025   Medication Access/Adherence  Current Pharmacy:  [name] [address] [place] [address] Phone: [phone] Fax: [phone]  [facility] [phone] [place] [address] AT [facility] [address] [place] [address] Phone: [phone] Fax: [phone]  [id number] [place],  - [address] [place] [address] Phone: [phone] Fax: [phone]   Patient reports affordability concerns with their medications: No   Patient reports access/transportation concerns to their pharmacy: Yes  - He lives in [place], and sometimes has difficulty getting to the [facility] pharmacy in [place] for his medications. Does not want to switch pharmacies, but I provided him with information for the [facility] pharmacy in [facility].  Patient reports adherence concerns with their medications:  Yes  - he has not been taking losartan  (in many months), he ran out of Jardiance  recently, he has not ever picked up atorvastatin , and he was not taking Ozempic  correctly (as he was not aware he had to dial  up to 0.25 mg)   Type 2 Diabetes:  Current medications: Ozempic  0.25 mg (on Sundays - but has not been getting full dose due to incorrect pen use), Lantus  36 units once daily, metformin  IR 1000 mg BID (taking 500 mg BID), Jardiance  10 mg daily (has been out since yesterday, will be picking up from the pharmacy)  Current glucose readings: using Dexcom G7  Date of Download: 04/27/24    Patient denies hypoglycemic s/sx including dizziness, shakiness, sweating. Patient denies hyperglycemic symptoms including polyuria, polydipsia, polyphagia, nocturia, neuropathy, blurred vision.  Current meal patterns: Did not discuss in-depth today. Patient reports he does not eat large portion sizes at baseline.   Current physical activity: did not discuss today  Current medication access support:  Medicaid  Macrovascular and Microvascular Risk Reduction:  Statin? Prescribed atorvastatin  40 mg daily, but he has not started taking yet; ACEi/ARB? Prescribed losartan  25 mg daily, but patient has not taken in several months - will remove from list today Last urinary albumin/creatinine ratio:  Lab Results  Component Value Date   MICRALBCREAT 26 12/06/2023   MICRALBCREAT 42 (H) 01/29/2020   MICRALBCREAT <2.1 09/24/2015   Last eye exam:  Lab Results  Component Value Date   HMDIABEYEEXA No Retinopathy 02/03/2016   Last foot exam: 03/24/2024 Tobacco Use:  Tobacco Use: High Risk (03/27/2024)   Patient History    Smoking Tobacco Use: Every Day    Smokeless Tobacco Use: Never    Passive Exposure: Not on file    Objective:  BP Readings from Last 3 Encounters:  03/24/24 (!) 163/88  12/06/23 (!) 151/94  11/18/23 (!) 149/91    Lab Results  Component Value Date   HGBA1C 11.7 (A) 03/24/2024   HGBA1C 12.2 (A) 10/28/2023   HGBA1C >14.0 (A) 01/29/2020       Latest Ref Rng & Units 03/31/2024   10:37 AM 03/24/2024   10:30 AM 12/06/2023   10:20 AM  BMP  Glucose 70 - 99 mg/dL 786  808  839   BUN 6 - 24 mg/dL 15  16  13    Creatinine 0.76 - 1.27 mg/dL 8.33  7.96  8.34   BUN/Creat Ratio 9 - 20   8   Sodium 134 - 144 mmol/L 137  140  140   Potassium 3.5 - 5.2 mmol/L 4.0  4.0  4.4   Chloride 96 - 106 mmol/L 105  104  105   CO2 20 - 29 mmol/L 17  20  23    Calcium  8.7 - 10.2 mg/dL 8.9  9.3  9.3     Lab Results  Component Value Date   CHOL 218 (H) 03/24/2024   HDL 33 (L) 03/24/2024   LDLCALC 129 (H) 03/24/2024   TRIG 314 (H) 03/24/2024   CHOLHDL 6.6 (H) 03/24/2024    Medications Reviewed Today     Reviewed by Brinda Lorain SQUIBB, RPH (Pharmacist) on 04/27/24 at 1514  Med List Status: <None>   Medication Order Taking? Sig Documenting Provider Last Dose Status Informant    Discontinued 04/27/24 1418 (Patient Preference)   amLODipine  (NORVASC ) 10 MG tablet [id number] Yes Take 1 tablet  (10 mg total) by mouth daily. Koomson, Julius, MD  Active   atorvastatin  (LIPITOR) 40 MG tablet [id number]  Take 1 tablet (40 mg total) by mouth daily.  Patient not taking: Reported on 04/27/2024   Celestina Czar, MD  Active   blood glucose meter kit and supplies KIT [id number]  Dispense based on patient and insurance preference. Use up to four times daily as directed. (FOR ICD-9 250.00, 250.01). Stephanie Freund, MD  Active     Discontinued 04/27/24 1510 (Patient Preference)     Discontinued 04/27/24 1509 (No longer needed (for PRN medications))   Continuous Glucose Sensor (DEXCOM G7 SENSOR) MISC [id number] Yes Replace sensor every 10 days. Koomson, Julius, MD  Active   empagliflozin  (JARDIANCE ) 10 MG TABS tablet  [id number]  Take 1 tablet (10 mg total) by mouth daily before breakfast.  Patient not taking: Reported on 04/27/2024   Koomson, Julius, MD  Active   glucose blood (TRUE METRIX BLOOD GLUCOSE TEST) test strip [id number]  Check blood sugars 3 (three) times daily. Stephanie Freund, MD  Active   insulin  glargine (LANTUS ) 100 UNIT/ML Solostar Pen [id number] Yes Inject 36 Units into the skin 2 (two) times daily.  Patient taking differently: Inject 36 Units into the skin 2 (two) times daily.   Celestina Czar, MD  Active   Insulin  Pen Needle 32G X 4 MM MISC [id number]  Use as directed. Stephanie Freund, MD  Active     Discontinued 04/27/24 1511 (No longer needed (for PRN medications))     Discontinued 04/27/24 1420 (Patient has not taken in last 30 days)    Discontinued 04/27/24 1513 (Dose change)            Med Note>> Brinda Lorain SQUIBB, [facility]   04/27/2024  3:13 PM kidney function    Semaglutide ,0.25 or 0.5MG /DOS, 2 MG/3ML SOPN [id number] Yes Inject 0.25 mg into the skin once a week. Koomson, Julius, MD  Active   sildenafil  (VIAGRA ) 50 MG tablet [id number]  Take 1 tablet (50 mg total) by mouth as needed for erectile dysfunction. Addie Perkins, DO  Active     Discontinued 04/27/24 1511 (Patient Preference)                Assessment/Plan:   Type 2 Diabetes: - Currently uncontrolled with most recent A1C of 11.7% above goal <7%. Glycemic control slightly improved with improved adherence, with GMI over the past two weeks of 8.8%. Patient has not been administering Ozempic  correctly, so he was further education on administration instructions today, and will plan to start taking 0.25 mg weekly x4 weeks, then increase to 0.5 mg weekly. Therefore, will not make additional changes today, as this should improve his glycemic control. Encouraged him to continue taking Lantus , Jardiance , and metformin  consistently as prescribed. Agree with continuing reduced dose metformin  (TDD 1000 mg) given fluctuating renal function.  - Last UACR April 2025 - 21 mg/g - Patient denies personal or family history of multiple endocrine neoplasia type 2, medullary thyroid cancer; personal history of pancreatitis or gallbladder disease. - Reviewed long term cardiovascular and renal outcomes of uncontrolled blood sugar - Reviewed goal A1c, goal fasting, and goal 2 hour post prandial glucose - Reviewed hypoglycemia management plan and the rule of 15 - Reviewed dietary modifications including  utilizing the healthy plate method, limiting portion size of carbohydrate foods, increasing intake of protein and non-starchy vegetables. Counseled patient to stay hydrated with water throughout the day. - Recommend to START Ozempic  0.25 mg weekly x4 weeks then increase to 0.5 mg weekly as previously prescribed.  - Recommend to continue Lantus  36 units daily, Jardiance  10 mg daily, metformin  XR 500 mg BID  - Recommend to check glucose continuously with Dexcom G7 - Next A1C due 06/24/24       Hypertension: - Currently uncontrolled with clinic BP consistently above goal less than 130/80, though patient reported he was not taking his medication as prescribed at this appointment. Patient reports he is now compliant with amlodipine . Removed losartan   from medication list, as patient stated he had not taken in several months. If BP continue to be uncontrolled at PCP follow-up, should consider re-initiation of ARB given DM + kidney disease, though he will need close lab monitoring with dx of CKD3a, last eGFR 51  mL/min.   - Reviewed long term cardiovascular and renal outcomes of uncontrolled blood pressure - Will confirm patient has home BP monitor at follow-up - Recommend to continue amlodipine  10 mg daily     Hyperlipidemia/ASCVD Risk Reduction: - Currently uncontrolled with most recent LDL-C of 129 mg/dL above goal < 70 mg/dL given DM + comorbidities, and TG > 300 mg/dL. Expect TG to improve with glycemic control. Encouraged patient to start taking high intensity statin, as previously prescribed.  - Reviewed long term complications of uncontrolled cholesterol - Recommend to start atorvastatin  40 mg daily as previously prescribed   Written patient instructions provided. Patient verbalized understanding of treatment plan.   Follow Up Plan:  Pharmacist telephone 05/18/24 PCP clinic visit - needs to be scheduled ~06/24/24   Lorain Baseman, PharmD [facility] [phone]

## 2024-05-01 ENCOUNTER — Other Ambulatory Visit

## 2024-05-09 ENCOUNTER — Other Ambulatory Visit (HOSPITAL_COMMUNITY): Payer: Self-pay

## 2024-05-18 ENCOUNTER — Telehealth: Payer: Self-pay

## 2024-05-18 ENCOUNTER — Other Ambulatory Visit

## 2024-05-18 ENCOUNTER — Other Ambulatory Visit (HOSPITAL_COMMUNITY): Payer: Self-pay

## 2024-05-18 NOTE — Telephone Encounter (Signed)
 Attempted to contact patient for scheduled appointment for medication management. Left HIPAA compliant message for patient to return my call at their convenience.   Lorain Baseman, PharmD [facility] [phone]

## 2024-05-18 NOTE — Telephone Encounter (Signed)
 Patient returned my call but stated he needed to reschedule appt due to work. Rescheduled for Mon 10/6. He reported that he needs refills of all his medications. He is out of his blood pressure medication, and never picked up cholesterol medication. Reports that he was not able to pick up refills of meds due to financial constraints, but he gets paid tomorrow and will be able to pick them up. Requested fills for all maintenance medications for [employer] insurance) at [facility].   Lorain Baseman, PharmD [facility] [phone]

## 2024-05-18 NOTE — Progress Notes (Deleted)
 05/18/2024 Name: Wayne Leonard MRN: [id number] DOB: 1976-07-07  No chief complaint on file.   Wayne Leonard is a 48 y.o. year old male who presented for a telephone visit.   They were referred to the pharmacist by their PCP for assistance in managing diabetes. PMH includes HTN, OSA, T2DM, HLD, CKD3a, tobacco use, BMI > 30.    Subjective: Patient was last seen by PCP, Missy Sandhoff, MD, on 03/24/24. At last visit, BP was elevated to 163/88 mmHg, HR 75 bpm. A1C had improved from 12.2% to 11.7%. Patient was prescribed Lantus  30 units BID but reported only taking 30 units once daily due to his work schedule (5PM-2AM) and was intermittently noncompliant with metformin . Reported polyuria and polydipsia. TIR was only 16% with average BG of 270 mg/dL. He was instructed to start Ozempic  0.25 mg weekly, take Lantus  36 units once daily, continue metformin  1000 mg BID, and continue Jardiance  10 mg daily. He was also instructed to start atorvastatin  40 mg daily. However, his BMP returned with evidence of an AKI, (Scr 1.6 > 2.03 mg/dL) and he was instructed to hold amlodipine , losartan , Jardiance , and metformin . Repeat BMP demonstrated Scr returned to 1.6 mg/dL. He was engaged by pharmacy via telephone on 04/27/24. Discovered that he had only been dialing Ozempic  up by 1 click, rather than to 0.25 mg, so he was given instructions to restart at 0.25 mg weekly. He was taking metformin  at a reduced dose of 500 mg BID and he had recently run out of Jardiance  and atorvastatin . He was counseled to restart his medications as prescribed.67  Today, patient reports doing well. ***  Needs to pick up more sensors  He reports he has been taking Ozempic  0.25 mg weekly, but upon further investigation he has only been dialing up to the first dash on the pen, rather than scrolling all the way up to 0.25 mg - so he has not actually taken any substantial amount of Ozempic  yet. He is taking metformin  at a reduced dose of 1 tab  (500 mg) BID. He reports that he ran out of Jardiance  yesterday and needs to pick it up, along with atorvastatin . He has been taking Lantus  36 units once daily   Care Team: Primary Care Provider: Sandhoff Missy, MD ; Next Scheduled Visit: Needs to be scheduled ~ Nov 2025   Medication Access/Adherence  Current Pharmacy:  [name] - [facility] [address], [address] [place] [address] Phone: [phone] Fax: [phone]  [phone] [place] - [address] AT [facility] [address] [address] [place] [address] Phone: [phone] Fax: [phone]  [id number] [place] - [address] [place] [address] Phone: [phone] Fax: [phone]   Patient reports affordability concerns with their medications: No   Patient reports access/transportation concerns to their pharmacy: Yes  - He lives in [place], and sometimes has difficulty getting to the [facility] pharmacy in [place] for his medications. Does not want to switch pharmacies, but I provided him with information for the [facility] in [facility].  Patient reports adherence concerns with their medications:  Yes  - he has not been taking losartan  (in many months), he ran out of Jardiance  recently, he has not ever picked up atorvastatin , and he was not taking Ozempic  correctly (as he was not aware he had to dial  up to 0.25  mg)   Type 2 Diabetes:  Current medications: Ozempic  0.25 mg (on Sundays - but has not been getting full dose due to incorrect pen use), Lantus  36 units once daily, metformin  IR 1000 mg BID (taking 500 mg BID), Jardiance  10 mg daily (has been out since yesterday, will be picking up from the pharmacy)  Current glucose readings: using Dexcom G7  Date of Download: 05/18/24 - but last data from 05/05/24    Patient denies hypoglycemic s/sx  including dizziness, shakiness, sweating. Patient denies hyperglycemic symptoms including polyuria, polydipsia, polyphagia, nocturia, neuropathy, blurred vision.  Current meal patterns: Did not discuss in-depth today. Patient reports he does not eat large portion sizes at baseline.   Current physical activity: did not discuss today  Current medication access support: Medicaid  Macrovascular and Microvascular Risk Reduction:  Statin? Prescribed atorvastatin  40 mg daily, but he has not started taking yet; ACEi/ARB? Prescribed losartan  25 mg daily, but patient has not taken in several months - will remove from list today Last urinary albumin/creatinine ratio:  Lab Results  Component Value Date   MICRALBCREAT 26 12/06/2023   MICRALBCREAT 42 (H) 01/29/2020   MICRALBCREAT <2.1 09/24/2015   Last eye exam:  Lab Results  Component Value Date   HMDIABEYEEXA No Retinopathy 02/03/2016   Last foot exam: 03/24/2024 Tobacco Use:  Tobacco Use: High Risk (03/27/2024)   Patient History    Smoking Tobacco Use: Every Day    Smokeless Tobacco Use: Never    Passive Exposure: Not on file    Objective:  BP Readings from Last 3 Encounters:  03/24/24 (!) 163/88  12/06/23 (!) 151/94  11/18/23 (!) 149/91    Lab Results  Component Value Date   HGBA1C 11.7 (A) 03/24/2024   HGBA1C 12.2 (A) 10/28/2023   HGBA1C >14.0 (A) 01/29/2020       Latest Ref Rng & Units 03/31/2024   10:37 AM 03/24/2024   10:30 AM 12/06/2023   10:20 AM  BMP  Glucose 70 - 99 mg/dL 786  808  839   BUN 6 - 24 mg/dL 15  16  13    Creatinine 0.76 - 1.27 mg/dL 8.33  7.96  8.34   BUN/Creat Ratio 9 - 20   8   Sodium 134 - 144 mmol/L 137  140  140   Potassium 3.5 - 5.2 mmol/L 4.0  4.0  4.4   Chloride 96 - 106 mmol/L 105  104  105   CO2 20 - 29 mmol/L 17  20  23    Calcium  8.7 - 10.2 mg/dL 8.9  9.3  9.3     Lab Results  Component Value Date   CHOL 218 (H) 03/24/2024   HDL 33 (L) 03/24/2024   LDLCALC 129 (H) 03/24/2024    TRIG 314 (H) 03/24/2024   CHOLHDL 6.6 (H) 03/24/2024    Medications Reviewed Today   Medications were not reviewed in this encounter       Assessment/Plan:   Type 2 Diabetes: - Currently uncontrolled with most recent A1C of 11.7% above goal <7%. Glycemic control slightly improved with improved adherence, with GMI over the past two weeks of 8.8%. Patient has not been administering Ozempic  correctly, so he was further education on administration instructions today, and will plan to start taking 0.25 mg weekly x4 weeks, then increase to 0.5 mg weekly. Therefore, will not make additional changes today, as this should improve his glycemic control. Encouraged him to continue taking Lantus , Jardiance , and metformin  consistently as prescribed. Agree with  continuing reduced dose metformin  (TDD 1000 mg) given fluctuating renal function.  - Last UACR April 2025 - 21 mg/g - Patient denies personal or family history of multiple endocrine neoplasia type 2, medullary thyroid cancer; personal history of pancreatitis or gallbladder disease. - Reviewed long term cardiovascular and renal outcomes of uncontrolled blood sugar - Reviewed goal A1c, goal fasting, and goal 2 hour post prandial glucose - Reviewed hypoglycemia management plan and the rule of 15 - Reviewed dietary modifications including  utilizing the healthy plate method, limiting portion size of carbohydrate foods, increasing intake of protein and non-starchy vegetables. Counseled patient to stay hydrated with water throughout the day. - Recommend to START Ozempic  0.25 mg weekly x4 weeks then increase to 0.5 mg weekly as previously prescribed.  - Recommend to continue Lantus  36 units daily, Jardiance  10 mg daily, metformin  XR 500 mg BID  - Recommend to check glucose continuously with Dexcom G7 - Next A1C due 06/24/24       Hypertension: - Currently uncontrolled with clinic BP consistently above goal less than 130/80, though patient reported he  was not taking his medication as prescribed at this appointment. Patient reports he is now compliant with amlodipine . Removed losartan  from medication list, as patient stated he had not taken in several months. If BP continue to be uncontrolled at PCP follow-up, should consider re-initiation of ARB given DM + kidney disease, though he will need close lab monitoring with dx of CKD3a, last eGFR 51 mL/min.   - Reviewed long term cardiovascular and renal outcomes of uncontrolled blood pressure - Will confirm patient has home BP monitor at follow-up - Recommend to continue amlodipine  10 mg daily     Hyperlipidemia/ASCVD Risk Reduction: - Currently uncontrolled with most recent LDL-C of 129 mg/dL above goal < 70 mg/dL given DM + comorbidities, and TG > 300 mg/dL. Expect TG to improve with glycemic control. Encouraged patient to start taking high intensity statin, as previously prescribed.  - Reviewed long term complications of uncontrolled cholesterol - Recommend to start atorvastatin  40 mg daily as previously prescribed   Written patient instructions provided. Patient verbalized understanding of treatment plan.   Follow Up Plan:  Pharmacist telephone 05/18/24 PCP clinic visit - needs to be scheduled ~06/24/24   Lorain Baseman, PharmD [facility] [phone]

## 2024-05-22 ENCOUNTER — Other Ambulatory Visit

## 2024-05-22 ENCOUNTER — Telehealth: Payer: Self-pay

## 2024-05-22 DIAGNOSIS — Z5181 Encounter for therapeutic drug level monitoring: Secondary | ICD-10-CM

## 2024-05-22 DIAGNOSIS — E1165 Type 2 diabetes mellitus with hyperglycemia: Secondary | ICD-10-CM

## 2024-05-22 NOTE — Progress Notes (Signed)
 05/22/2024 Name: Wayne Leonard MRN: [id number] DOB: Dec 08, 1975  Chief Complaint  Patient presents with   Diabetes    Wayne Leonard is a 48 y.o. year old male who presented for a telephone visit.   They were referred to the pharmacist by their PCP for assistance in managing diabetes. PMH includes HTN, OSA, T2DM, HLD, CKD3a, tobacco use, BMI > 30.    Subjective: Patient was last seen by PCP, Missy Sandhoff, MD, on 03/24/24. At last visit, BP was elevated to 163/88 mmHg, HR 75 bpm. A1C had improved from 12.2% to 11.7%. Patient was prescribed Lantus  30 units BID but reported only taking 30 units once daily due to his work schedule (5PM-2AM) and was intermittently noncompliant with metformin . Reported polyuria and polydipsia. TIR was only 16% with average BG of 270 mg/dL. He was instructed to start Ozempic  0.25 mg weekly, take Lantus  36 units once daily, continue metformin  1000 mg BID, and continue Jardiance  10 mg daily. He was also instructed to start atorvastatin  40 mg daily. However, his BMP returned with evidence of an AKI, (Scr 1.6 > 2.03 mg/dL) and he was instructed to hold amlodipine , losartan , Jardiance , and metformin . Repeat BMP demonstrated Scr returned to 1.6 mg/dL. He was engaged by pharmacy via telephone on 04/27/24. Discovered that he had only been dialing Ozempic  up by 1 click, rather than to 0.25 mg, so he was given instructions to restart at 0.25 mg weekly. He was taking metformin  at a reduced dose of 500 mg BID and he had recently run out of Jardiance  and atorvastatin . He was counseled to restart his medications as prescribed. He was briefly engaged by pharmacy via telephone on 05/18/24 and I assisted him in obtaining refills from the pharmacy.  Today, patient reports doing well. He was currently picking his son up from school during the call, so it was an abbreviated visit. He did pick up his medications from the pharmacy. Reports since our last call, he has been taking Ozempic  0.25 mg  weekly. Has noticed some effects of appetite suppression. Reports he has only been taking metformin  XR 500 mg one tablet once daily.   Care Team: Primary Care Provider: Sandhoff Missy, MD ; Next Scheduled Visit: Needs to be scheduled ~ Nov 2025   Medication Access/Adherence  Current Pharmacy:  [name] - [facility] [address] [place] [address] Phone: [phone] Fax: [phone]  [phone] [place] - [address] AT [facility] [address] [address] [place] [address] Phone: [phone] Fax: [phone]  [id number] [place] - [address] [place] [address] Phone: [phone] Fax: [phone]   Patient reports affordability concerns with their medications: No   Patient reports access/transportation concerns to their pharmacy: Yes  - He lives in [place], and sometimes has difficulty getting to the [facility] pharmacy in [place] for his medications. Does not want to switch pharmacies, but I previously provided him with information for the [facility] pharmacy in [facility].  Patient reports adherence concerns with their medications:  Yes  - history of taking medications incorrectly.   Type 2 Diabetes:  Current medications: Ozempic  0.25 mg (Sundays), Lantus  36 units once daily, metformin  IR 1000 mg BID (taking 500 mg once daily), Jardiance  10 mg daily  Current glucose readings: using Dexcom G7  Date of Download: 05/22/24 - after phone call on Thursday  05/18/24, patient picked up and applied new CGM on 05/19/24.     Patient denies hypoglycemic s/sx including dizziness, shakiness, sweating. Patient denies hyperglycemic symptoms including polyuria, polydipsia, polyphagia, nocturia, neuropathy, blurred vision.  Current meal patterns: Did not discuss in-depth today. Patient reports he does not eat large  portion sizes at baseline.   Current physical activity: did not discuss today  Current medication access support: Medicaid  Macrovascular and Microvascular Risk Reduction:  Statin? Atorvastatin  40 mg daily - recently started a few days agot; ACEi/ARB? None currently, previously prescribed losartan  but he stopped taking  Last urinary albumin/creatinine ratio:  Lab Results  Component Value Date   MICRALBCREAT 26 12/06/2023   MICRALBCREAT 42 (H) 01/29/2020   MICRALBCREAT <2.1 09/24/2015   Last eye exam:  Lab Results  Component Value Date   HMDIABEYEEXA No Retinopathy 02/03/2016   Last foot exam: 03/24/2024 Tobacco Use:  Tobacco Use: High Risk (03/27/2024)   Patient History    Smoking Tobacco Use: Every Day    Smokeless Tobacco Use: Never    Passive Exposure: Not on file    Objective:  BP Readings from Last 3 Encounters:  03/24/24 (!) 163/88  12/06/23 (!) 151/94  11/18/23 (!) 149/91    Lab Results  Component Value Date   HGBA1C 11.7 (A) 03/24/2024   HGBA1C 12.2 (A) 10/28/2023   HGBA1C >14.0 (A) 01/29/2020       Latest Ref Rng & Units 03/31/2024   10:37 AM 03/24/2024   10:30 AM 12/06/2023   10:20 AM  BMP  Glucose 70 - 99 mg/dL 786  808  839   BUN 6 - 24 mg/dL 15  16  13    Creatinine 0.76 - 1.27 mg/dL 8.33  7.96  8.34   BUN/Creat Ratio 9 - 20   8   Sodium 134 - 144 mmol/L 137  140  140   Potassium 3.5 - 5.2 mmol/L 4.0  4.0  4.4   Chloride 96 - 106 mmol/L 105  104  105   CO2 20 - 29 mmol/L 17  20  23    Calcium  8.7 - 10.2 mg/dL 8.9  9.3  9.3     Lab Results  Component Value Date   CHOL 218 (H) 03/24/2024   HDL 33 (L) 03/24/2024   LDLCALC 129 (H) 03/24/2024   TRIG 314 (H) 03/24/2024   CHOLHDL 6.6 (H) 03/24/2024    Medications Reviewed Today     Reviewed by Brinda Lorain SQUIBB, RPH (Pharmacist) on 05/22/24 at 1649  Med List Status: <None>   Medication Order Taking? Sig Documenting Provider Last Dose Status Informant  amLODipine  (NORVASC ) 10 MG tablet [id number]  Yes Take 1 tablet (10 mg total) by mouth daily. Koomson, Julius, MD  Active   atorvastatin  (LIPITOR) 40 MG tablet [id number] Yes Take 1 tablet (40 mg total) by mouth daily. Celestina Czar, MD  Active     Discontinued 05/22/24 1648 (No longer needed (for PRN medications))   Continuous Glucose Sensor (DEXCOM G7 SENSOR) MISC [id number] Yes Replace sensor every 10 days. Koomson, Julius, MD  Active   empagliflozin  (JARDIANCE ) 10 MG TABS tablet [id number] Yes Take 1 tablet (10 mg total) by mouth daily before breakfast. Celestina Czar, MD  Active   glucose blood (TRUE METRIX BLOOD GLUCOSE TEST) test strip [id number]  Check blood sugars 3 (three) times daily. Stephanie Freund, MD  Active   insulin  glargine (LANTUS ) 100 UNIT/ML Solostar Pen [id number] Yes Inject 36 Units into the skin daily. Celestina Czar, MD  Active   Insulin  Pen Needle 32G X 4 MM MISC [id number]  Use as directed. Stephanie Freund, MD  Active   metFORMIN  (GLUCOPHAGE -XR) 500 MG 24 hr tablet [id number] Yes Take 1 tablet (500 mg total) by mouth 2 (two) times daily with a meal.  Patient taking differently: Take 1 tablet (500 mg total) by mouth 2 (two) times daily with a meal.   Celestina Czar, MD  Active   Semaglutide ,0.25 or 0.5MG /DOS, 2 MG/3ML NELMA [id number] Yes Inject 0.5 mg into the skin once a week.  Patient taking differently: Inject 0.5 mg into the skin once a week.   Koomson, Julius, MD  Active   sildenafil  (VIAGRA ) 50 MG tablet [id number]  Take 1 tablet (50 mg total) by mouth as needed for erectile dysfunction. Addie Perkins, DO  Active               Assessment/Plan:   Type 2 Diabetes: - Currently uncontrolled with most recent A1C of 11.7% above goal <7%. Glycemic control improved since starting Ozempic  (TIR 69% close to goal > 70% over the last 4 days). He is tolerating well and is agreeable to increase to 0.5 mg weekly today, with goal of decreasing TDD of insulin . Encouraged him to restart metformin  XR 500 mg twice daily instead of  once daily (TDD of metformin  1000 mg/d due to fluctuating renal function).  - Last UACR April 2025 - 21 mg/g - Patient denies personal or family history of multiple endocrine neoplasia type 2, medullary thyroid cancer; personal history of pancreatitis or gallbladder disease. - Reviewed long term cardiovascular and renal outcomes of uncontrolled blood sugar - Reviewed goal A1c, goal fasting, and goal 2 hour post prandial glucose - Reviewed hypoglycemia management plan and the rule of 15 - Reviewed dietary modifications including  utilizing the healthy plate method, limiting portion size of carbohydrate foods, increasing intake of protein and non-starchy vegetables. Counseled patient to stay hydrated with water throughout the day. - Recommend to INCREASE Ozempic  to 0.5 mg weekly as previously prescribed.  - Recommend to DECREASE Lantus  to 30 units daily when he increases Ozempic  to 0.5 mg weekly next Sunday - Recommend to continue Jardiance  10 mg daily - Recommend to increase metformin  XR to 500 mg BID as prescribed - Recommend to check glucose continuously with Dexcom G7 - Next A1C due 06/24/24       Hypertension: - Currently uncontrolled with clinic BP consistently above goal less than 130/80, though patient reported he was not taking his medication as prescribed at this appointment. Patient reports he is now compliant with amlodipine . If BP continue to be uncontrolled at PCP follow-up, should consider re-initiation of ARB given DM + kidney disease, though he will need close lab monitoring with dx of CKD3a, last eGFR 51 mL/min.   - Reviewed long term cardiovascular and renal outcomes of uncontrolled blood pressure - Will confirm patient has home BP monitor at follow-up - Recommend to continue amlodipine  10 mg daily     Hyperlipidemia/ASCVD Risk Reduction: - Currently uncontrolled with most recent LDL-C of 129 mg/dL above goal < 70 mg/dL given DM + comorbidities, and TG > 300 mg/dL. Expect TG  to improve with glycemic control. High intensity statin is indicated, and patient recently picked up on 05/19/24. - Reviewed long term complications of uncontrolled cholesterol - Recommend to continue atorvastatin  40 mg daily - Repeat lipid panel 6-12 weeks after initiating statin   Written patient instructions provided. Patient verbalized understanding of treatment plan.  Follow Up Plan:  Pharmacist in person 06/26/24 PCP clinic visit - needs to be scheduled    Lorain Baseman, PharmD [facility] [phone]

## 2024-05-22 NOTE — Telephone Encounter (Signed)
 Attempted to contact patient for scheduled appointment for medication management. Left HIPAA compliant message for patient to return my call at their convenience.   Lorain Baseman, PharmD [facility] [phone]

## 2024-06-23 ENCOUNTER — Other Ambulatory Visit: Payer: Self-pay

## 2024-06-26 ENCOUNTER — Ambulatory Visit

## 2024-06-26 ENCOUNTER — Telehealth: Payer: Self-pay

## 2024-06-26 ENCOUNTER — Telehealth: Payer: Self-pay | Admitting: *Deleted

## 2024-06-26 NOTE — Telephone Encounter (Signed)
 Attempted to contact patient for scheduled appointment for medication management. Unable to leave VM.   Reviewed patient's Clarity report, which demonstrates excellent glycemic control without hypoglycemia. Will attempt to reschedule     Lorain Baseman, PharmD [facility] [phone]

## 2024-06-26 NOTE — Telephone Encounter (Signed)
 Copied from CRM [id number]. Topic: Appointments - Appointment Cancel/Reschedule >> Jun 23, 2024  4:20 PM Miquel SAILOR wrote: Patient/patient representative is calling to cancel or reschedule an appointment. Refer to attachments for appointment information.   Cancelled app for 11/10 at 10:45 am transferred to pharmacy for cancel app 11/10 at 9:30   [facility] in [place], [place] [address] [place], [place], [place] [address]  Open  Closes 6 PM  More hours [phone]

## 2024-06-26 NOTE — Progress Notes (Deleted)
 06/26/2024 Name: Wayne Leonard MRN: [id number] DOB: [birth year]  No chief complaint on file.   Wayne Leonard is a 48 y.o. year old male who presented for a telephone visit.   They were referred to the pharmacist by their PCP for assistance in managing diabetes. PMH includes HTN, OSA, T2DM, HLD, CKD3a, tobacco use, BMI > 30.    Subjective: Patient was last seen by PCP, Missy Sandhoff, MD, on 03/24/24. At last visit, BP was elevated to 163/88 mmHg, HR 75 bpm. A1C had improved from 12.2% to 11.7%. Patient was prescribed Lantus  30 units BID but reported only taking 30 units once daily due to his work schedule (5PM-2AM) and was intermittently noncompliant with metformin . Reported polyuria and polydipsia. TIR was only 16% with average BG of 270 mg/dL. He was instructed to start Ozempic  0.25 mg weekly, take Lantus  36 units once daily, continue metformin  1000 mg BID, and continue Jardiance  10 mg daily. He was also instructed to start atorvastatin  40 mg daily. However, his BMP returned with evidence of an AKI, (Scr 1.6 > 2.03 mg/dL) and he was instructed to hold amlodipine , losartan , Jardiance , and metformin . Repeat BMP demonstrated Scr returned to 1.6 mg/dL. He was engaged by pharmacy via telephone on 04/27/24. Discovered that he had only been dialing Ozempic  up by 1 click, rather than to 0.25 mg, so he was given instructions to restart at 0.25 mg weekly. He was taking metformin  at a reduced dose of 500 mg BID and he had recently run out of Jardiance  and atorvastatin . He was counseled to restart his medications as prescribed. He was briefly engaged by pharmacy via telephone on 05/18/24 and I assisted him in obtaining refills from the pharmacy. At pharmacy call on 05/22/24, patient was instructed to increase Ozempic  to 0.5 mg weekly and reduce Lantus  to 30 units daily.  Today, ***  Today, patient reports doing well. He was currently picking his son up from school during the call, so it was an abbreviated  visit. He did pick up his medications from the pharmacy. Reports since our last call, he has been taking Ozempic  0.25 mg weekly. Has noticed some effects of appetite suppression. Reports he has only been taking metformin  XR 500 mg one tablet once daily.   Care Team: Primary Care Provider: Sandhoff Missy, MD ; Next Scheduled Visit: Needs to be scheduled ~ Nov 2025   Medication Access/Adherence  Current Pharmacy:  [name] [address] [place] [address] Phone: [phone] Fax: [phone]  [facility] [phone] [place] - [address] AT [facility] [address] [place] [address] Phone: [phone] Fax: [phone]  [id number] [place] [place] - [address] [place] [address] Phone: [phone] Fax: [phone]   Patient reports affordability concerns with their medications: No   Patient reports access/transportation concerns to their pharmacy: Yes  - He lives in [place], and sometimes has difficulty getting to the [facility] pharmacy in [place] for his medications. Does not want to switch pharmacies, but I previously provided him with information for the [facility] pharmacy in [facility].  Patient reports adherence concerns with their medications:  Yes  - history of taking medications incorrectly.   Type 2 Diabetes:  Current medications: Ozempic  0.5 mg (Sundays), Lantus  30 units once daily, metformin  IR 1000 mg BID (taking 500 mg once daily), Jardiance  10  mg daily  Current glucose readings: using Dexcom G7  Date of Download: 06/26/24    Patient denies hypoglycemic s/sx including dizziness, shakiness, sweating. Patient denies hyperglycemic symptoms including polyuria, polydipsia, polyphagia, nocturia, neuropathy, blurred vision.  Current meal patterns: Did not discuss in-depth today. Patient  reports he does not eat large portion sizes at baseline.   Current physical activity: did not discuss today  Current medication access support: Medicaid  Macrovascular and Microvascular Risk Reduction:  Statin? Atorvastatin  40 mg daily - recently started a few days agot; ACEi/ARB? None currently, previously prescribed losartan  but he stopped taking  Last urinary albumin/creatinine ratio:  Lab Results  Component Value Date   MICRALBCREAT 26 12/06/2023   MICRALBCREAT 42 (H) 01/29/2020   MICRALBCREAT <2.1 09/24/2015   Last eye exam:  Lab Results  Component Value Date   HMDIABEYEEXA No Retinopathy 02/03/2016   Last foot exam: 03/24/2024 Tobacco Use:  Tobacco Use: High Risk (03/27/2024)   Patient History    Smoking Tobacco Use: Every Day    Smokeless Tobacco Use: Never    Passive Exposure: Not on file    Objective:  BP Readings from Last 3 Encounters:  03/24/24 (!) 163/88  12/06/23 (!) 151/94  11/18/23 (!) 149/91    Lab Results  Component Value Date   HGBA1C 11.7 (A) 03/24/2024   HGBA1C 12.2 (A) 10/28/2023   HGBA1C >14.0 (A) 01/29/2020       Latest Ref Rng & Units 03/31/2024   10:37 AM 03/24/2024   10:30 AM 12/06/2023   10:20 AM  BMP  Glucose 70 - 99 mg/dL 786  808  839   BUN 6 - 24 mg/dL 15  16  13    Creatinine 0.76 - 1.27 mg/dL 8.33  7.96  8.34   BUN/Creat Ratio 9 - 20   8   Sodium 134 - 144 mmol/L 137  140  140   Potassium 3.5 - 5.2 mmol/L 4.0  4.0  4.4   Chloride 96 - 106 mmol/L 105  104  105   CO2 20 - 29 mmol/L 17  20  23    Calcium  8.7 - 10.2 mg/dL 8.9  9.3  9.3     Lab Results  Component Value Date   CHOL 218 (H) 03/24/2024   HDL 33 (L) 03/24/2024   LDLCALC 129 (H) 03/24/2024   TRIG 314 (H) 03/24/2024   CHOLHDL 6.6 (H) 03/24/2024    Medications Reviewed Today   Medications were not reviewed in this encounter       Assessment/Plan:   Type 2 Diabetes: - Currently uncontrolled with most recent A1C of 11.7% above goal <7%. Glycemic control  improved since starting Ozempic  (TIR 69% close to goal > 70% over the last 4 days). He is tolerating well and is agreeable to increase to 0.5 mg weekly today, with goal of decreasing TDD of insulin . Encouraged him to restart metformin  XR 500 mg twice daily instead of once daily (TDD of metformin  1000 mg/d due to fluctuating renal function).  - Last UACR April 2025 - 21 mg/g - Patient denies personal or family history of multiple endocrine neoplasia type 2, medullary thyroid cancer; personal history of pancreatitis or gallbladder disease. - Reviewed long term cardiovascular and renal outcomes of uncontrolled blood sugar - Reviewed goal A1c, goal fasting, and goal 2 hour post prandial glucose - Reviewed hypoglycemia management plan and the rule of 15 - Reviewed dietary modifications including  utilizing the healthy plate method, limiting portion size of carbohydrate foods, increasing  intake of protein and non-starchy vegetables. Counseled patient to stay hydrated with water throughout the day. - Recommend to INCREASE Ozempic  to 0.5 mg weekly as previously prescribed.  - Recommend to DECREASE Lantus  to 30 units daily when he increases Ozempic  to 0.5 mg weekly next Sunday - Recommend to continue Jardiance  10 mg daily - Recommend to increase metformin  XR to 500 mg BID as prescribed - Recommend to check glucose continuously with Dexcom G7 - Next A1C due 06/24/24       Hypertension: - Currently uncontrolled with clinic BP consistently above goal less than 130/80, though patient reported he was not taking his medication as prescribed at this appointment. Patient reports he is now compliant with amlodipine . If BP continue to be uncontrolled at PCP follow-up, should consider re-initiation of ARB given DM + kidney disease, though he will need close lab monitoring with dx of CKD3a, last eGFR 51 mL/min.   - Reviewed long term cardiovascular and renal outcomes of uncontrolled blood pressure - Will confirm  patient has home BP monitor at follow-up - Recommend to continue amlodipine  10 mg daily     Hyperlipidemia/ASCVD Risk Reduction: - Currently uncontrolled with most recent LDL-C of 129 mg/dL above goal < 70 mg/dL given DM + comorbidities, and TG > 300 mg/dL. Expect TG to improve with glycemic control. High intensity statin is indicated, and patient recently picked up on 05/19/24. - Reviewed long term complications of uncontrolled cholesterol - Recommend to continue atorvastatin  40 mg daily - Repeat lipid panel 6-12 weeks after initiating statin   Written patient instructions provided. Patient verbalized understanding of treatment plan.    Follow Up Plan:  Pharmacist in person 06/26/24 PCP clinic visit - needs to be scheduled    Lorain Baseman, PharmD [facility] [phone]

## 2024-06-29 ENCOUNTER — Telehealth: Payer: Self-pay

## 2024-06-29 NOTE — Progress Notes (Signed)
 Complex Care Management Care Guide Note  06/29/2024 Name: Constantin Hillery MRN: [id number] DOB: 05/22/1976  Williom Cedar is a 48 y.o. year old male who is a primary care patient of Koomson, Missy, MD and is actively engaged with the care management team. I reached out to Willian Sargeant by phone today to assist with re-scheduling  with the Pharmacist.  Follow up plan: Unsuccessful telephone outreach attempt made. A HIPAA compliant phone message was left for the patient providing contact information and requesting a return call.  [name] [facility], [facility] Guide  Direct Dial : [phone]  Fax [phone]

## 2024-07-07 NOTE — Progress Notes (Signed)
 Complex Care Management Care Guide Note  07/07/2024 Name: Wayne Leonard MRN: [id number] DOB: [birth year]  Wayne Leonard is a 48 y.o. year old male who is a primary care patient of Koomson, Missy, MD and is actively engaged with the care management team. I reached out to Demichael Bossard by phone today to assist with re-scheduling  with the Pharmacist.  Follow up plan: Unsuccessful telephone outreach attempt made. A HIPAA compliant phone message was left for the patient providing contact information and requesting a return call.  [name] [facility], [facility] Guide  Direct Dial : [phone]  Fax [phone]

## 2024-07-11 ENCOUNTER — Telehealth: Payer: Self-pay | Admitting: Pharmacy Technician

## 2024-07-11 NOTE — Progress Notes (Signed)
   07/11/2024  Patient ID: Wayne Leonard, male   DOB: 21-Sep-1975, 48 y.o.   MRN: [id number]  Patient engaged with clinical pharmacist for management of diabetes on 05/22/2024. Outreach by [employer] technician was requested.   Outreached patient to discuss diabetes medication management. Left voicemail for patient to return my call at their convenience.    Twan Harkin, CPhT [facility] Office: [phone] Email: [email or web address]

## 2024-07-17 ENCOUNTER — Telehealth: Payer: Self-pay | Admitting: Pharmacy Technician

## 2024-07-17 NOTE — Progress Notes (Signed)
   07/17/2024  Patient ID: Wayne Leonard, male   DOB: 03/22/1976, 48 y.o.   MRN: [id number]  Patient engaged with clinical pharmacist for management of diabetes on 05/22/2024. Outreach by [employer] technician was requested.   Outreached patient to discuss diabetes medication management. Today, the message says voicemail box is not set up yet, please try call again later, goodbye.  Angelise Petrich, CPhT [facility] Office: [phone] Email: [email or web address]

## 2024-07-19 ENCOUNTER — Telehealth: Payer: Self-pay | Admitting: Pharmacy Technician

## 2024-07-19 NOTE — Progress Notes (Signed)
 07/19/2024 Name: Wayne Leonard MRN: [id number] DOB: [birth year]  Patient is appearing for a follow-up visit with the population health pharmacy technician. Last engaged with the clinical pharmacist to discuss diabetes on 05/22/2024. Contacted patient today to discuss diabetes.   Plan from last clinical pharmacist appointment:  Type 2 Diabetes: - Currently uncontrolled with most recent A1C of 11.7% above goal <7%. Glycemic control improved since starting Ozempic  (TIR 69% close to goal > 70% over the last 4 days). He is tolerating well and is agreeable to increase to 0.5 mg weekly today, with goal of decreasing TDD of insulin . Encouraged him to restart metformin  XR 500 mg twice daily instead of once daily (TDD of metformin  1000 mg/d due to fluctuating renal function).  - Last UACR April 2025 - 21 mg/g - Patient denies personal or family history of multiple endocrine neoplasia type 2, medullary thyroid cancer; personal history of pancreatitis or gallbladder disease. - Reviewed long term cardiovascular and renal outcomes of uncontrolled blood sugar - Reviewed goal A1c, goal fasting, and goal 2 hour post prandial glucose - Reviewed hypoglycemia management plan and the rule of 15 - Reviewed dietary modifications including  utilizing the healthy plate method, limiting portion size of carbohydrate foods, increasing intake of protein and non-starchy vegetables. Counseled patient to stay hydrated with water throughout the day. - Recommend to INCREASE Ozempic  to 0.5 mg weekly as previously prescribed.  - Recommend to DECREASE Lantus  to 30 units daily when he increases Ozempic  to 0.5 mg weekly next Sunday - Recommend to continue Jardiance  10 mg daily - Recommend to increase metformin  XR to 500 mg BID as prescribed - Recommend to check glucose continuously with Dexcom G7 - Next A1C due 06/24/24   Hypertension: - Currently uncontrolled with clinic BP consistently above goal less than 130/80, though patient  reported he was not taking his medication as prescribed at this appointment. Patient reports he is now compliant with amlodipine . If BP continue to be uncontrolled at PCP follow-up, should consider re-initiation of ARB given DM + kidney disease, though he will need close lab monitoring with dx of CKD3a, last eGFR 51 mL/min.   - Reviewed long term cardiovascular and renal outcomes of uncontrolled blood pressure - Will confirm patient has home BP monitor at follow-up - Recommend to continue amlodipine  10 mg daily  Hyperlipidemia/ASCVD Risk Reduction: - Currently uncontrolled with most recent LDL-C of 129 mg/dL above goal < 70 mg/dL given DM + comorbidities, and TG > 300 mg/dL. Expect TG to improve with glycemic control. High intensity statin is indicated, and patient recently picked up on 05/19/24. - Reviewed long term complications of uncontrolled cholesterol - Recommend to continue atorvastatin  40 mg daily - Repeat lipid panel 6-12 weeks after initiating statin  -Written patient instructions provided. Patient verbalized understanding of treatment plan.  -Follow Up Plan:  Pharmacist in person 06/26/24 PCP clinic visit - needs to be scheduled (copy/paste from last note)   Medication Adherence Barriers Identified:  Patient made recommended medication changes per plan: No Patient missed his in person pharmacist appointment on 06/26/24. Was able to speak to patient via phone call today. Patient informs he is taking Jardiance  10mg  daily, Lantus  at 30 units daily, Metformin  XR 500mg  twice a day and Ozempic  0.5mg  weekly. He also informs he takes Amlodipine  10mg  daily. He informs he is NOT taking Atorvastatin  40mg  daily. He reports he stopped taking it about a month ago due to he heard there was a recall on the medication. Will follow up  with pharmacist about this concern. Access issues with any new medication or testing device: No Patient informs he called in the Ozempic  this week and plans to pick it up  today. Per Dr Annemarie, Ozempic  was filled on 12/1 for 3ml. Patient appears adherent to medication regimen with 90 day supply of Jardiance , Metformin , Amlodipine  and Atorvastatin  being filled on 10/3. Lantus  was filled for 30ml on 10/3 and Dexcom for 9 sensors on 10/3.  Patient is checking blood sugars as prescribed: Yes Patient informs he wears Dexcom sensor daily. He was driving at the time of the call and could not provide GMI or time in range data. He was able to inform that his blood sugar at 10:45am today was 120. He reports overall his blood sugars are doing very good. He informs he is connected to [facility] and pharmacist should be able to see his blood sugars and trends. Patient reports he has not received his work schedule for January but was willing to meet pharmacist in person in January. Was able to schedule patient for an in person visit on 08/21/2024 at 3pm.    Medication Adherence Barriers Addressed/Actions Taken:  Reviewed medication changes per plan from last clinical pharmacist note Educated patient to contact pharmacy regarding new prescriptions Reviewed instructions for monitoring blood sugars at home and reminded patient to keep a written log to review with pharmacist Reminded patient of date/time of upcoming clinical pharmacist follow up and any upcoming PCP/specialists visits. Patient denies transportation barriers to the appointment. Yes  Next clinical pharmacist appointment is scheduled for: 08/21/2024 in person visit at 3pm  Kate Caddy, CPhT [facility] Office: [phone] Email: [email or web address]

## 2024-07-20 ENCOUNTER — Encounter (HOSPITAL_COMMUNITY): Payer: Self-pay | Admitting: Pharmacy Technician

## 2024-07-20 ENCOUNTER — Other Ambulatory Visit (HOSPITAL_COMMUNITY): Payer: Self-pay

## 2024-07-20 ENCOUNTER — Telehealth: Payer: Self-pay | Admitting: Pharmacy Technician

## 2024-07-20 NOTE — Patient Outreach (Signed)
 Erroneous Encounter.  Wayne Leonard, CPhT [facility] Office: [phone] Email: [email or web address]

## 2024-07-20 NOTE — Progress Notes (Signed)
 Confirmed patients TIR and GMI are at goal (AGP included below). Should advise patient to restart atorvastatin  after confirming with pharmacy that he did not receive recalled batch. Certain lots of atorvastatin  from a particular manufacturer were recalled due to impaired dissolution, which could affect efficacy, but is not expected to cause harm to the patient.        Wayne Leonard, PharmD [facility] [phone]

## 2024-07-20 NOTE — Progress Notes (Signed)
 07/20/2024 Name: Wayne Leonard MRN: [id number] DOB: [birth year]  Patient is appearing for a follow-up visit with the population health pharmacy technician. Last engaged with the clinical pharmacist to discuss diabetes on 05/22/2024. Contacted patient today to discuss medication adherence.   Plan from last clinical pharmacist appointment:  Type 2 Diabetes: - Currently uncontrolled with most recent A1C of 11.7% above goal <7%. Glycemic control improved since starting Ozempic  (TIR 69% close to goal > 70% over the last 4 days). He is tolerating well and is agreeable to increase to 0.5 mg weekly today, with goal of decreasing TDD of insulin . Encouraged him to restart metformin  XR 500 mg twice daily instead of once daily (TDD of metformin  1000 mg/d due to fluctuating renal function).  - Last UACR April 2025 - 21 mg/g - Patient denies personal or family history of multiple endocrine neoplasia type 2, medullary thyroid cancer; personal history of pancreatitis or gallbladder disease. - Reviewed long term cardiovascular and renal outcomes of uncontrolled blood sugar - Reviewed goal A1c, goal fasting, and goal 2 hour post prandial glucose - Reviewed hypoglycemia management plan and the rule of 15 - Reviewed dietary modifications including  utilizing the healthy plate method, limiting portion size of carbohydrate foods, increasing intake of protein and non-starchy vegetables. Counseled patient to stay hydrated with water throughout the day. - Recommend to INCREASE Ozempic  to 0.5 mg weekly as previously prescribed.  - Recommend to DECREASE Lantus  to 30 units daily when he increases Ozempic  to 0.5 mg weekly next Sunday - Recommend to continue Jardiance  10 mg daily - Recommend to increase metformin  XR to 500 mg BID as prescribed - Recommend to check glucose continuously with Dexcom G7 - Next A1C due 06/24/24   Hypertension: - Currently uncontrolled with clinic BP consistently above goal less than 130/80, though  patient reported he was not taking his medication as prescribed at this appointment. Patient reports he is now compliant with amlodipine . If BP continue to be uncontrolled at PCP follow-up, should consider re-initiation of ARB given DM + kidney disease, though he will need close lab monitoring with dx of CKD3a, last eGFR 51 mL/min.   - Reviewed long term cardiovascular and renal outcomes of uncontrolled blood pressure - Will confirm patient has home BP monitor at follow-up - Recommend to continue amlodipine  10 mg daily  Hyperlipidemia/ASCVD Risk Reduction: - Currently uncontrolled with most recent LDL-C of 129 mg/dL above goal < 70 mg/dL given DM + comorbidities, and TG > 300 mg/dL. Expect TG to improve with glycemic control. High intensity statin is indicated, and patient recently picked up on 05/19/24. - Reviewed long term complications of uncontrolled cholesterol - Recommend to continue atorvastatin  40 mg daily - Repeat lipid panel 6-12 weeks after initiating statin  -Written patient instructions provided. Patient verbalized understanding of treatment plan.  -Follow Up Plan:  Pharmacist in person 06/26/24 PCP clinic visit - needs to be scheduled(copy/paste from last note)   Medication Adherence Barriers Identified:  Received message from PharmD in regard to question posed to her yesterday that informed her that patient had stopped taking Atorvastatin  due to he heard it had been recalled. PharmD responded that patient should restart Atorvastatin  after confirming with the pharmacy that he did not received recalled batch. Collaborated with [facility]. Spoke to [place] the pharmacist who informs unless patient received a direct call from them or a letter in the mail that the medication should be safe to take and was not part of  the specific manufacturer recall. Unsuccessful outreach to patient. Detailed but HIPAA complaint voicemail was left for patient  informing him that certain lots of Atorvastatin  from a specific manufacturer were recalled due to impaired dissolution which could affect efficacy but was not expected to cause harm to the patient. Also informed in the message that the pharmacy he received the medication from says the medication is safe to take based on information received from their pharmacist.   Next clinical pharmacist appointment is scheduled for: 08/21/2024  Oscar Forman, CPhT [facility] Office: [phone] Email: [email or web address]

## 2024-07-27 ENCOUNTER — Other Ambulatory Visit (HOSPITAL_COMMUNITY): Payer: Self-pay

## 2024-08-21 ENCOUNTER — Other Ambulatory Visit (HOSPITAL_COMMUNITY): Payer: Self-pay

## 2024-08-21 ENCOUNTER — Other Ambulatory Visit: Payer: Self-pay

## 2024-08-21 ENCOUNTER — Ambulatory Visit (INDEPENDENT_AMBULATORY_CARE_PROVIDER_SITE_OTHER)

## 2024-08-21 VITALS — BP 158/101 | HR 95

## 2024-08-21 DIAGNOSIS — Z7984 Long term (current) use of oral hypoglycemic drugs: Secondary | ICD-10-CM

## 2024-08-21 DIAGNOSIS — Z794 Long term (current) use of insulin: Secondary | ICD-10-CM | POA: Diagnosis not present

## 2024-08-21 DIAGNOSIS — E1165 Type 2 diabetes mellitus with hyperglycemia: Secondary | ICD-10-CM

## 2024-08-21 DIAGNOSIS — E119 Type 2 diabetes mellitus without complications: Secondary | ICD-10-CM

## 2024-08-21 LAB — GLUCOSE, CAPILLARY: Glucose-Capillary: 356 mg/dL — ABNORMAL HIGH (ref 70–99)

## 2024-08-21 LAB — POCT GLYCOSYLATED HEMOGLOBIN (HGB A1C): HbA1c, POC (controlled diabetic range): 8.6 % — AB (ref 0.0–7.0)

## 2024-08-21 MED ORDER — INSULIN PEN NEEDLE 32G X 4 MM MISC
4 refills | Status: AC
  Filled 2024-08-21: qty 100, 25d supply, fill #0
  Filled 2024-09-19: qty 100, 30d supply, fill #0

## 2024-08-21 NOTE — Progress Notes (Signed)
 "  08/21/2024 Name: Wayne Leonard MRN: [id number] DOB: June 18, 1976  Chief Complaint  Patient presents with   Diabetes   Hypertension    Kaz Auld is a 49 y.o. year old male who presented for a face-to-face visit.   They were referred to the pharmacist by their PCP for assistance in managing diabetes. PMH includes HTN, OSA, T2DM, HLD, CKD3a, tobacco use, BMI > 30.    Subjective: Patient was last seen by PCP, Missy Sandhoff, MD, on 03/24/24. At last visit, BP was elevated to 163/88 mmHg, HR 75 bpm. A1C had improved from 12.2% to 11.7%. Patient was prescribed Lantus  30 units BID but reported only taking 30 units once daily due to his work schedule (5PM-2AM) and was intermittently noncompliant with metformin . Reported polyuria and polydipsia. TIR was only 16% with average BG of 270 mg/dL. He was instructed to start Ozempic  0.25 mg weekly, take Lantus  36 units once daily, continue metformin  1000 mg BID, and continue Jardiance  10 mg daily. He was also instructed to start atorvastatin  40 mg daily. However, his BMP returned with evidence of an AKI, (Scr 1.6 > 2.03 mg/dL) and he was instructed to hold amlodipine , losartan , Jardiance , and metformin . Repeat BMP demonstrated Scr returned to 1.6 mg/dL. He was engaged by pharmacy via telephone on 04/27/24. Discovered that he had only been dialing Ozempic  up by 1 click, rather than to 0.25 mg, so he was given instructions to restart at 0.25 mg weekly. He was taking metformin  at a reduced dose of 500 mg BID and he had recently run out of Jardiance  and atorvastatin . He was counseled to restart his medications as prescribed. He was briefly engaged by pharmacy via telephone on 05/18/24 and I assisted him in obtaining refills from the pharmacy. When I last reviewed his CGM data on 06/26/24, TIR was 9.2%, GMI was 6.3%.   Today, patient reports doing ok. He changed jobs and is now working days instead of nights. Reports that this has caused him to be off schedule and he  has not been taking his medications consistently for ~2 months. He has been out of Ozempic  for > 1 mo, and is taking his Lantus  and oral medications only a couple times per week. He is due for refills today and is willing to pick up from pharmacy after leaving our appt. He does not have a home BP cuff. He reports he has been smoking again since March (quit for 3 years while he was incarcerated). Smoking ~1 ppd. He did not take amlodipine  today.   Care Team: Primary Care Provider: Sandhoff Missy, MD ; Next Scheduled Visit: Needs to be scheduled    Medication Access/Adherence  Current Pharmacy:  [place] - [facility] [address], [address] [place] [address] Phone: [phone] Fax: [phone]  [id number] [place] [place] - [address] AT [facility] [address] [address] [place] [address] Phone: [phone] Fax: [phone]  [id number] [place] [place] - [address] AT [facility] [address] [place] [address] Phone: [phone] Fax: [phone]  [facility] - [facility] [address], [address] [place] [address] Phone: [phone] Fax: [phone]   Patient reports affordability concerns with their medications: No   Patient reports access/transportation concerns to their pharmacy: Yes  - He lives in [place], and sometimes has difficulty getting to the [facility] pharmacy in [place] for his  medications. Does not want to switch pharmacies, but I previously provided him with information for the [facility] pharmacy in [facility]. Also discussed possibility of delivery today.  Patient reports adherence concerns with their medications:  Yes  - history of taking medications incorrectly.   Type 2 Diabetes:  Current medications: Ozempic  0.25 mg (out for ~2 mo)), Lantus  36 units once daily (taking 30 units daily  ~once per week), metformin  IR 1000 mg BID (taking 500 mg once daily - forgetting doses)), Jardiance  10 mg daily (forgetting doses frequently) Current glucose readings: using Dexcom G7  Date of Download: 08/21/24    Patient denies hypoglycemic s/sx including dizziness, shakiness, sweating. Patient denies hyperglycemic symptoms including polyuria, polydipsia, polyphagia, nocturia, neuropathy, blurred vision.  Current meal patterns: Did not discuss in-depth today. Reports appetite has increased since stopping Ozempic .  Current physical activity: did not discuss today  Current medication access support: Medicaid - reports copays are affordable  Macrovascular and Microvascular Risk Reduction:  Statin? Atorvastatin  40 mg daily - had not been taking following recall, willing to restart; ACEi/ARB? None currently, previously prescribed losartan  but he does not recall ever taking this. Not willing to add this today.  Last urinary albumin/creatinine ratio:  Lab Results  Component Value Date   MICRALBCREAT 26 12/06/2023   MICRALBCREAT 42 (H) 01/29/2020   MICRALBCREAT <2.1 09/24/2015   Last eye exam:  Lab Results  Component Value Date   HMDIABEYEEXA No Retinopathy 02/03/2016   Last foot exam: 03/24/2024 Tobacco Use:  Tobacco Use: High Risk (03/27/2024)   Patient History    Smoking Tobacco Use: Every Day    Smokeless Tobacco Use: Never    Passive Exposure: Not on file    Objective:  BP Readings from Last 3 Encounters:  08/21/24 (!) 158/101  03/24/24 (!) 163/88  12/06/23 (!) 151/94    Lab Results  Component Value Date   HGBA1C 8.6 (A) 08/21/2024   HGBA1C 11.7 (A) 03/24/2024   HGBA1C 12.2 (A) 10/28/2023       Latest Ref Rng & Units 03/31/2024   10:37 AM 03/24/2024   10:30 AM 12/06/2023   10:20 AM  BMP  Glucose 70 - 99 mg/dL 786  808  839   BUN 6 - 24 mg/dL 15  16  13    Creatinine 0.76 - 1.27 mg/dL 8.33  7.96  8.34   BUN/Creat Ratio 9 - 20   8   Sodium 134 - 144 mmol/L 137   140  140   Potassium 3.5 - 5.2 mmol/L 4.0  4.0  4.4   Chloride 96 - 106 mmol/L 105  104  105   CO2 20 - 29 mmol/L 17  20  23    Calcium  8.7 - 10.2 mg/dL 8.9  9.3  9.3     Lab Results  Component Value Date   CHOL 218 (H) 03/24/2024   HDL 33 (L) 03/24/2024   LDLCALC 129 (H) 03/24/2024   TRIG 314 (H) 03/24/2024   CHOLHDL 6.6 (H) 03/24/2024    Medications Reviewed Today     Reviewed by Wayne Leonard, RPH-CPP (Pharmacist) on 08/21/24 at 1615  Med List Status: <None>   Medication Order Taking? Sig Documenting Provider Last Dose Status Informant  amLODipine  (NORVASC ) 10 MG tablet [id number] Yes Take 1 tablet (10 mg total) by mouth daily. Koomson, Julius, MD  Active   atorvastatin  (LIPITOR) 40 MG tablet [id number] Yes Take 1 tablet (40 mg total) by mouth daily. Celestina Czar, MD  Active  Continuous Glucose Sensor (DEXCOM G7 SENSOR) MISC [id number] Yes Replace sensor every 10 days. Koomson, Julius, MD  Active   empagliflozin  (JARDIANCE ) 10 MG TABS tablet [id number] Yes Take 1 tablet (10 mg total) by mouth daily before breakfast. Celestina Czar, MD  Active   glucose blood (TRUE METRIX BLOOD GLUCOSE TEST) test strip [id number]  Check blood sugars 3 (three) times daily. Stephanie Freund, MD  Active   insulin  glargine (LANTUS ) 100 UNIT/ML Solostar Pen [id number] Yes Inject 36 Units into the skin daily. Celestina Czar, MD  Active   Insulin  Pen Needle 32G X 4 MM MISC [id number]  Use as directed. Lovie Clarity, MD  Active   metFORMIN  (GLUCOPHAGE -XR) 500 MG 24 hr tablet [id number] Yes Take 1 tablet (500 mg total) by mouth 2 (two) times daily with a meal. Celestina Czar, MD  Active   Semaglutide ,0.25 or 0.5MG /DOS, 2 MG/3ML NELMA [id number] Yes Inject 0.5 mg into the skin once a week. Koomson, Julius, MD  Active   sildenafil  (VIAGRA ) 50 MG tablet [id number]  Take 1 tablet (50 mg total) by mouth as needed for erectile dysfunction. Addie Perkins, DO  Active               Assessment/Plan:   Type 2  Diabetes: - Currently uncontrolled with most recent A1C of 8.6% above goal <7%, but much improved from 11.7%. Recent glycemic control was at goal, before patient stopped taking medication ~1.5 months ago due to a change in his work schedule. He is motivated to improve control again. Will restart Ozempic  with expedited titration. TDD of metformin  1000 mg/d due to fluctuating renal function.  - Last UACR April 2025 - 21 mg/g - Patient denies personal or family history of multiple endocrine neoplasia type 2, medullary thyroid cancer; personal history of pancreatitis or gallbladder disease. - Reviewed long term cardiovascular and renal outcomes of uncontrolled blood sugar - Reviewed goal A1c, goal fasting, and goal 2 hour post prandial glucose - Reviewed hypoglycemia management plan and the rule of 15 - Reviewed dietary modifications including  utilizing the healthy plate method, limiting portion size of carbohydrate foods, increasing intake of protein and non-starchy vegetables. Counseled patient to stay hydrated with water throughout the day. - Recommend to RESTART Ozempic  at 0.25 mg x 2 weeks, then increase to 0.5 mg weekly.  - Recommend to DECREASE Lantus  to 26 units daily when restarting Ozempic  - Recommend to continue Jardiance  10 mg daily - Recommend to increase metformin  XR to 500 mg BID as prescribed - Recommend to check glucose continuously with Dexcom G7 - Next A1C due 11/19/24     Hypertension: - Currently uncontrolled with clinic BP consistently above goal less than 130/80, but patient had not taken BP medication. Discussed we would recommend ARB + amlodipine , but patient only willing to restart amlodipine  today. In the future, will re-discuss ARB given multiple indications with DM + CKD. Will need close lab monitoring with dx of CKD3a, last eGFR 51 mL/min.   - Reviewed long term cardiovascular and renal outcomes of uncontrolled blood pressure - Will confirm patient has home BP monitor at  follow-up - Recommend to continue amlodipine  10 mg daily     Hyperlipidemia/ASCVD Risk Reduction: - Currently uncontrolled with most recent LDL-C of 129 mg/dL above goal < 70 mg/dL given DM + comorbidities, and TG > 300 mg/dL. Expect TG to improve with glycemic control. High intensity statin is indicated. Encouraged patient to resume daily dosing. Advised to quit smoking and discussed pharmacotherapy options.  -  Reviewed long term complications of uncontrolled cholesterol - Recommend to continue atorvastatin  40 mg daily - Repeat lipid panel 6-12 weeks after consistent dosing of statin. - Discussed options for smoking cessation today including NRT + varenicline . Patient will think about it and we can discuss at follow-up visit.   Written patient instructions provided. Patient verbalized understanding of treatment plan.    Collaborated with [facility] to fill all his maintenance medications, Lantus , Ozempic , CGMs prior to the end of his appt. He was instructed to pick up medications from [facility] pharmacy prior to leaving.    Follow Up Plan:  Pharmacist in person 09/18/24 PCP clinic visit - needs to be scheduled    Lorain Baseman, PharmD [facility] [phone]   "

## 2024-08-21 NOTE — Patient Instructions (Addendum)
 It was nice to see you today!  Your goal blood sugar is 80-130 mg/dL before eating and less than 180 mg/dL after eating. Continue using Dexcom G7 sensors.  If you ever need to request your medications to be mailed out, you can call [phone] [facility] [employer]).   Medication Changes: RESTART Ozempic  0.25 mg ONCE WEEKLY for 2 weeks, then if tolerating, increase to Ozempic  0.5 mg weekly. Continue at 0.5 mg weekly until our next appointment. If you run out, there are refills on file at the pharmacy.  Decrease Lantus  to 26 units daily. If you have low blood sugar less than 70 mg/dL with consistently taking your medication, you can decrease to 22 units. If it continues to happen, please call and let us  know.   Do your best to take your medications consistently. You can take in the morning or evening, whichever is more convenient for you.   Consider your options for quitting smoking. This will reduce your risk of heart attack and stroke, and help control your blood pressure. This includes:  1. Nicotine  patch (wear for 24 hours ever day, change once per day) 2. Nicotine  gum or lozenge as needed  3. Varenicline  (Chantix ) - which is 1 pill twice daily with food to reduce cravings to smoke.   People can use one or all three of these, but tend to have the most success using Chantix  PLUS nicotine  replacement (gum, patch, etc).   Continue all other medication the same.   If you experience symptoms of a low blood sugar (dizzy, shaky, sweaty) check your blood sugar. If it is less than 70 mg/dL, you should eat or drink 15 grams of fast-acting carbohydrates like 4 glucose tablets, 1/2 cup of regular soda, or 1/2 cup of juice. Recheck your blood sugar after 15 minutes. If the level is still below 70 mg/dL repeat the process. If this occurs frequently, you should notify your healthcare provider.   Lifestyle Recommendations:  Aim for 150 minutes of moderate intensity exercise every week. This  is any activity that elevates your heart rate and breathing rate, but still allows you to carry on a conversation. Exercising after meals can help prevent spikes in your blood sugar.  Diet Recommendations:   Try to eat 3 real meals using the healthy plate method and 1-2 snacks per day. Never go more than 4-5 hours while awake without eating. Eat breakfast within the first hour of getting up.     Carbohydrates include starch, sugar, and fiber. Sugar and starch raise blood glucose.  Starchy foods include bread, rice, pasta, potatoes, corn, cereal, grits, crackers, bagels, muffins, all baked foods Some fruits are higher in sugar, like grapes, watermelon, oranges, and most tropical fruits. Limit your serving size to 1/2 cup at a time.  Protein foods include meat, fish, poultry, eggs, dairy, and beans (although beans also provide carbohydrates).  Non-starchy vegetables do not impact your blood sugar very much. These include greens, broccoli, asparagus, carrots, cauliflower, cucumber, mushrooms, peppers, yellow squash, zucchini squash, tomato, to name a few!  Avoid all sugary beverages. Stay hydrated with water throughout the day. Sparkling/flavored water, unsweet tea, black coffee, and zero-sugar or diet drinks will not impact your blood sugar, but they should not be used as your only source of hydration!  You can find more information at https://diabetes.org/food-nutrition/eating-healthy   Lorain Baseman, PharmD [facility] [phone]

## 2024-08-23 ENCOUNTER — Telehealth: Payer: Self-pay | Admitting: Pharmacy Technician

## 2024-08-23 NOTE — Progress Notes (Signed)
" ° °  08/23/2024  Patient ID: Wayne Leonard, male   DOB: April 20, 1976, 49 y.o.   MRN: [id number]  Patient engaged with clinical pharmacist for management of diabetes on 08/21/2024. Outreach by [employer] technician was requested.   Outreached patient to discuss diabetes and medication access medication management. Voicemail box is not set up so a message was unable to be left.  Theopolis Sloop, CPhT [facility] Office: [phone] Email: [email or web address]  "

## 2024-08-24 ENCOUNTER — Telehealth: Payer: Self-pay | Admitting: Pharmacy Technician

## 2024-08-24 NOTE — Progress Notes (Signed)
" ° °  08/24/2024  Patient ID: Wayne Leonard, male   DOB: 12-Jan-1976, 49 y.o.   MRN: [id number]  Patient engaged with clinical pharmacist for management of diabetes on 08/21/2024. Outreach by [employer] technician was requested.   Outreached patient to discuss diabetes, medication access, and medication adherence medication management. Left voicemail for patient to return my call at their convenience.    Shevawn Langenberg, CPhT [facility] Office: [phone] Email: [email or web address]  "

## 2024-08-28 ENCOUNTER — Telehealth: Payer: Self-pay | Admitting: Pharmacy Technician

## 2024-08-28 NOTE — Progress Notes (Signed)
" ° °  08/28/2024 Name: Wayne Leonard MRN: [id number] DOB: 1976/07/11  Patient is appearing for a follow-up visit with the population health pharmacy technician. Last engaged with the clinical pharmacist to discuss diabetes on 08/21/2024. Contacted patient today to discuss diabetes and medication access.   Plan from last clinical pharmacist appointment:  Type 2 Diabetes: - Currently uncontrolled with most recent A1C of 8.6% above goal <7%, but much improved from 11.7%. Recent glycemic control was at goal, before patient stopped taking medication ~1.5 months ago due to a change in his work schedule. He is motivated to improve control again. Will restart Ozempic  with expedited titration. TDD of metformin  1000 mg/d due to fluctuating renal function.  - Last UACR April 2025 - 21 mg/g - Patient denies personal or family history of multiple endocrine neoplasia type 2, medullary thyroid cancer; personal history of pancreatitis or gallbladder disease. - Reviewed long term cardiovascular and renal outcomes of uncontrolled blood sugar - Reviewed goal A1c, goal fasting, and goal 2 hour post prandial glucose - Reviewed hypoglycemia management plan and the rule of 15 - Reviewed dietary modifications including  utilizing the healthy plate method, limiting portion size of carbohydrate foods, increasing intake of protein and non-starchy vegetables. Counseled patient to stay hydrated with water throughout the day. - Recommend to RESTART Ozempic  at 0.25 mg x 2 weeks, then increase to 0.5 mg weekly.  - Recommend to DECREASE Lantus  to 26 units daily when restarting Ozempic  - Recommend to continue Jardiance  10 mg daily - Recommend to increase metformin  XR to 500 mg BID as prescribed - Recommend to check glucose continuously with Dexcom G7 - Next A1C due 11/19/24 Hypertension: - Currently uncontrolled with clinic BP consistently above goal less than 130/80, but patient had not taken BP medication. Discussed we would recommend  ARB + amlodipine , but patient only willing to restart amlodipine  today. In the future, will re-discuss ARB given multiple indications with DM + CKD. Will need close lab monitoring with dx of CKD3a, last eGFR 51 mL/min.   - Reviewed long term cardiovascular and renal outcomes of uncontrolled blood pressure - Will confirm patient has home BP monitor at follow-up - Recommend to continue amlodipine  10 mg daily  Hyperlipidemia/ASCVD Risk Reduction: - Currently uncontrolled with most recent LDL-C of 129 mg/dL above goal < 70 mg/dL given DM + comorbidities, and TG > 300 mg/dL. Expect TG to improve with glycemic control. High intensity statin is indicated. Encouraged patient to resume daily dosing. Advised to quit smoking and discussed pharmacotherapy options.  - Reviewed long term complications of uncontrolled cholesterol - Recommend to continue atorvastatin  40 mg daily - Repeat lipid panel 6-12 weeks after consistent dosing of statin. - Discussed options for smoking cessation today including NRT + varenicline . Patient will think about it and we can discuss at follow-up visit. -Written patient instructions provided. Patient verbalized understanding of treatment plan.  -Collaborated with [facility] to fill all his maintenance medications, Lantus , Ozempic , CGMs prior to the end of his appt. He was instructed to pick up medications from [facility] pharmacy prior to leaving. -Follow Up Plan:  Pharmacist in person 09/18/24 PCP clinic visit - needs to be scheduled (copy/paste from last note)   Medication Adherence Barriers Identified:  Patient informs he is currently in the hospital Will inform PharmD.  Next clinical pharmacist appointment is scheduled for: 09/18/2024   Ellisyn Icenhower, CPhT [facility] Office: [phone] Email: [email or web address]  "

## 2024-08-29 ENCOUNTER — Other Ambulatory Visit: Payer: Self-pay

## 2024-08-30 ENCOUNTER — Other Ambulatory Visit: Payer: Self-pay

## 2024-08-30 NOTE — H&P (Signed)
 ------------------------------------------------------------------------------- Attestation signed by Wayne Overman, MD at 08/30/2024  7:56 PM Case discussed with PA, I agree with PA's  plan of care.  I did not see and examine the patient today.  I was available for all questions and concerns.  Billing as per PA.  -------------------------------------------------------------------------------  Hospitalist Admission History and Physical    Chief Complaint  Abd Pain   HPI  Wayne Leonard is a 49 y.o. year old male with a PMH of T2DM, HLD, HTN, CKD 3, asthma presented to ED with abdominal pain, nausea, vomiting.  Patient was seen in the ED previously, diagnosed with gastroenteritis and prescribed Zofran but due to lack of improvement, came back into the hospital.  Patient states his abdominal pain, nausea and vomiting started on Saturday.  Describes it as an uncomfortable sensation more than acute pain currently.  Has not noticed significant change in pain with eating but states he has not been eating as much due to his abdominal pain.  Reports last bowel movement on Friday.  Has been able to pass gas.  States that he feels like something is in his abdomen making him feel uncomfortable and he is unable to clear it.  Denies any fevers, chills, shortness of breath, chest pain, headache, dizziness.  Discussed patient's hypertension, states his BP always runs high and he only takes amlodipine  at home.  Vitals on ED arrival include temp 99.2, HR 81, RR 20, BP 201/124, 98% on room air. ED workup showed WBC 11.51, microcytosis, glucose 194, creatinine 1.92, BUN 28, eGFR 43, lipase 245, magnesium 1.8, negative COVID/flu/RSV.  Ultrasound of abdomen unremarkable.  Patient was admitted to CDU for volume resuscitation. Repeat labs in CDU showing WBC 12.52, creatinine 1.63, glucose 182.  Given Tylenol, fentanyl, 1 L LR bolus, continuous LR infusion 100 cc an hour for 10 hours.   Assessment and Plan  Wayne Leonard is a 49 y.o. year old male with a PMH of T2DM, HLD, HTN, CKD 3 asthma presented to ED with abdominal pain, nausea, vomiting.    Patient was admitted to hospital medicine service for further management.    Principal Problem:   Abdominal pain Active Problems:   HLD (hyperlipidemia)   HTN (hypertension)   Type 2 diabetes mellitus with hyperglycemia, with long-term current use of insulin     (CMD)   Asthma (CMD)   Nausea and vomiting   Stage 3 chronic kidney disease (CMD) Resolved Problems:   * No resolved hospital problems. *   Assessment & Plan Abdominal pain Nausea and vomiting Presented to ED with upper abdominal pain, nausea, vomiting that started on 1/10.  Concern for possible pancreatitis given epigastric pain on ED arrival and elevated lipase at 245 so was started on liquid diet and given fluids.  Denies any alcohol use, drug use, new medications. Last bowel movement on Friday, 1/9 but is able to pass gas. - Continue clear liquids for now - Continue IVF until oral intake improves  - Continue supportive care, PRN nausea and pain medications - Follow up UA - Follow up CT abd pelvis - Started bowel regimen  Stage 3 chronic kidney disease (CMD) Creatinine on admission: 1.63. Baseline Creatinine: 1.6-1.8 per labs seen in care everywhere  - Monitor I/O - Avoid nephrotoxic medications - Renally dose medications - Monitor with labs daily Type 2 diabetes mellitus with hyperglycemia, with long-term current use of insulin     (CMD) Last a1c results: 16.8 on 09/07/2018.  Recheck A1c. - Reportedly takes lantus  30  units nightly, will hold for now given reduced PO intake, resume as appropriate as intake improves.  - FSBS ACHS, SSI - Consistent carb diet when appropriate - Hypoglycemia protocol HTN (hypertension) Remains hypertensive but asymptomatic. - Continue home amlodipine  10 mg  - Holding home hydrochlorothiazide with concern for dehydration, resume when appropriate - Will  start lisinopril  20 mg daily, adjust as appropriate HLD (hyperlipidemia) - Reportedly not taking statin due to recall. Asthma (CMD) Reports it to be stable.  - PRN albuterol     Diet: Adult Diet- Liquid; Clear liquids   Code Status: Full Code   DVT Prophylaxis: SCD  Anticipated disposition is to Home in 2-3 days.     History  Past Medical and Surgical History, Family History, Social History Medical History[1] Surgical History[2] Family History[3] Social History   Socioeconomic History   Marital status: Married    Spouse name: Not on file   Number of children: Not on file   Years of education: Not on file   Highest education level: Not on file  Occupational History   Not on file  Tobacco Use   Smoking status: Every Day   Smokeless tobacco: Never  Substance and Sexual Activity   Alcohol use: Yes   Drug use: Never   Sexual activity: Not on file  Other Topics Concern   Not on file  Social History Narrative   Not on file   Social Drivers of Health   Living Situation: Unknown (10/28/2023)   Received from [facility]   Living Situation    In the last 12 months, was there a time when you were not able to pay the mortgage or rent on time?: No    Number of Times Moved in the Last Year: Not on file    At any time in the past 12 months, were you homeless or living in a shelter (including now)?: No  Food Insecurity: No Food Insecurity (10/28/2023)   Received from [facility]   Food vital sign    Within the past 12 months, you worried that your food would run out before you got money to buy more: Never true    Within the past 12 months, the food you bought just didn't last and you didn't have money to get more: Never true  Transportation Needs: No Transportation Needs (10/28/2023)   Received from [facility] - [facility]    Lack of Transportation (Medical): No    Lack of Transportation (Non-Medical): No  Utilities: Not on file  Safety: Not  At Risk (10/28/2023)   Received from [facility]   Safety    Within the last year, have you been afraid of your partner or ex-partner?: No    Within the last year, have you been humiliated or emotionally abused in other ways by your partner or ex-partner?: No    Within the last year, have you been kicked, hit, slapped, or otherwise physically hurt by your partner or ex-partner?: No    Within the last year, have you been raped or forced to have any kind of sexual activity by your partner or ex-partner?: No  Alcohol Screening: Not At Risk (08/29/2024)   Alcohol    Audit C Alcohol risk score: 0  Tobacco Use: High Risk (03/27/2024)   Received from [facility]   Patient History    Smoking Tobacco Use: Every Day    Smokeless Tobacco Use: Never    Passive Exposure: Not on file  Depression: Not on file  Social Connections: Not on file  Financial Resource Strain: Not on file      Allergies  Allergies[4]   Home Medications  Home Medications           * amLODIPine  (NORVASC ) 10 mg tablet   * atorvastatin  (LIPITOR) 40 mg tablet   * empagliflozin  (JARDIANCE ) 10 mg tab   * hydroCHLOROthiazide (HYDRODIURIL) 25 mg tablet (Expired)    Take 25 mg by mouth Once Daily for 30 days.   * insulin  glargine (LANTUS  SoloStar) 100 unit/mL (3 mL) pen   * insulin  lispro (HumaLOG  U-100 Insulin ) 100 unit/mL injection (Expired)    Inject 4 Units under the skin 3 (three) times a day before meals for 30 days.   * lisinopriL  (PRINIVIL ) 20 mg tablet (Expired)    Take 20 mg by mouth Once Daily for 30 days.   * magnesium oxide 400 mg (241 mg magnesium) tab (Expired)    Take 400 mg by mouth 2 (two) times a day for 10 days.   * metFORMIN  (GLUCOPHAGE ) 500 mg tablet   * ondansetron (ZOFRAN-ODT) 4 mg disintegrating tablet    Dissolve 1 tablet (4 mg total) on tongue every 8 (eight) hours as needed for nausea or vomiting.   * potassium chloride  (KLOR-CON ) 20 mEq ER tablet (Expired)    Take 20 mEq by mouth Once  Daily for 5 days.        Review of Systems  Pertinent items are noted in HPI above.     Physical Exam  Temp:  [98.6 F (37 C)-99.2 F (37.3 C)] 98.6 F (37 C) Heart Rate:  [80-92] 89 Resp:  [16-20] 16 BP: (163-201)/(91-124) 194/99 Body mass index is 37.12 kg/m.  Physical Exam Constitutional:      General: He is not in acute distress. HENT:     Head: Normocephalic.  Eyes:     Extraocular Movements: Extraocular movements intact.  Cardiovascular:     Rate and Rhythm: Normal rate and regular rhythm.     Heart sounds: Normal heart sounds.  Pulmonary:     Effort: Pulmonary effort is normal.     Breath sounds: Normal breath sounds.  Abdominal:     Palpations: Abdomen is soft.     Comments: Reports diffuse discomfort with palpation of abdomen.   Skin:    General: Skin is warm and dry.  Neurological:     Mental Status: He is alert and oriented to person, place, and time.  Psychiatric:        Mood and Affect: Mood normal.        Behavior: Behavior normal.       Labs and Results  I have reviewed the following labs and results: Labs: Lab Results  Component Value Date   WBC 12.52 (H) 08/30/2024   HGB 14.4 08/30/2024   HCT 44.9 08/30/2024   MCV 77.4 (L) 08/30/2024   PLT 226 08/30/2024   Lab Results  Component Value Date   GLUCOSE 182 (H) 08/30/2024   CALCIUM  9.0 08/30/2024   NA 137 08/30/2024   K 4.1 08/30/2024   CO2 25 08/30/2024   CL 104 08/30/2024   BUN 19 08/30/2024   CREATININE 1.63 (H) 08/30/2024   Lab Results  Component Value Date   ALT 16 08/30/2024   AST 28 08/30/2024   BILITOT 0.6 08/30/2024   No results found for: INR, PROTIME  Micro: No results found for this visit on 08/29/24 (from the past 48 hours).  Radiology: US  Abdomen Complete  Final Result by Christin Deliliah Curb, MD [id number])  Date of Service: 2024-08-29 22:13:00    EXAM:  US  Abdomen complete    CLINICAL HISTORY:  Mainly assess for RUQ, biliary tree, pancreas.     TECHNIQUE:  Real-time ultrasound of the abdomen (complete) with image documentation.     COMPARISON:  None provided.    FINDINGS:    LIVER:  Unremarkable.     GALLBLADDER:  No gallstone. No gallbladder wall thickening. No pericholecystic fluid.     COMMON BILE DUCT:  No dilation.  CBD measures 4.4 mm.    PANCREAS:  Limit evaluation of the pancreas secondary to overlying bowel gas.    KIDNEYS:  Right kidney measures 10 cm.  Left kidney measures 10 cm..  No   hydronephrosis.     SPLEEN:  Unremarkable.     AORTA:  No aneurysm.     IVC:  Unremarkable as visualized.     IMPRESSION:    Unremarkable complete abdominal ultrasound.    If symptoms persist or worsen, CT exam could be considered.    Electronically signed by: Christin Curb, MD on 08/30/2024 03:47:59 AM   US [place]      EKG: No results found for this visit on 08/29/24.    Electronically signed by: Yolanda LULLA Fairly, PA-C 08/30/2024 1:30 PM        [1] Past Medical History: Diagnosis Date   Arthritis    Diabetes mellitus    (CMD)    Hypercholesterolemia    Hyperlipidemia    Hypertension   [2] Past Surgical History: Procedure Laterality Date   INCISION & DRAINAGE SOFT TISSUE ABSCESS, SUBFASCIAL Left 09/10/2018   Procedure: INCISION AND DRAINAGE SOFT TISSUE ABSCESS  Left buttock;  Surgeon: JULIANNA Vicenta Angst, MD;  Location: [facility] MAIN OR;  Service: General;  Laterality: Left;  [3] Family History Problem Relation Name Age of Onset   Diabetes Mother     Hypertension Mother    [4] Allergies Allergen Reactions   Iodine Other (See Comments)    Unknown to patient   Shellfish Containing Products Other (See Comments)    Tested allergic to this group of foods   Tomato Hives  *Some images could not be shown.

## 2024-08-30 NOTE — Progress Notes (Signed)
 "   Division of Pharmacy Services  Medication History Completion Note  Name/DOB/Age of Patient: Wayne Leonard / 02/02/1976 / 49 y.o.  Location: [facility]S  Type: Preadmission & Modality: In-Person  Confirmed two patient identifiers: Yes  Confirmed patient is alert & oriented: Yes  Medication History Source (Med History Informants):  Patient Dispense History   Asked about any missing medications (such as pumps, injectable meds, TPN, OTC, etc): Yes  PTA Med List:  Prior to Admission Medications     Reviewed by Glennis CHRISTELLA Mayotte, CPhT on 08/30/24 at 1604    Medication Sig Last Dose Informant Taking? Status  amLODIPine  (NORVASC ) 10 mg tablet Take 10 mg by mouth daily. 08/25/2024 Morning Self Yes Active         Med Note LYNWOOD GLENNIS CHRISTELLA Stevan Aug 30, 2024  3:59 PM) Lf 05/19/24 #90ds  atorvastatin  (LIPITOR) 40 mg tablet Take 40 mg by mouth daily.  Patient not taking: Reported on 08/30/2024   Not Taking Self No Active         Med Note LYNWOOD, JEMIYA M   Wed Aug 30, 2024  3:59 PM) Lf 05/19/24 #90ds, patient states medication was on recall   empagliflozin  (JARDIANCE ) 10 mg tab Take 10 mg by mouth daily. 08/25/2024 Morning Self Yes Active  hydroCHLOROthiazide (HYDRODIURIL) 25 mg tablet Take 25 mg by mouth Once Daily for 30 days.  Patient not taking: Reported on 08/30/2024   Not Taking Self No Flag for Review (Therapy completed)         Med Note LYNWOOD, JEMIYA M   Wed Aug 30, 2024  3:59 PM) Unable to verify with [name]   insulin  glargine (LANTUS  SoloStar) 100 unit/mL (3 mL) pen Inject 36 Units under the skin daily. into the skin  Self Yes Active         Med Note LYNWOOD, JEMIYA M   Wed Aug 30, 2024  4:00 PM) Lf 05/19/24 #83ds  insulin  lispro (HumaLOG  U-100 Insulin ) 100 unit/mL injection Inject 4 Units under the skin 3 (three) times a day before meals for 30 days.  Patient not taking: Reported on 08/30/2024   Not Taking Self No Active         Med Note LYNWOOD, JEMIYA M   Wed Aug 30, 2024   4:00 PM) Patient states never taking this medication   lisinopriL  (PRINIVIL ) 20 mg tablet Take 20 mg by mouth Once Daily for 30 days.  Patient not taking: Reported on 08/30/2024   Not Taking Self No Flag for Review (Discontinued by another clinician)  metFORMIN  (GLUCOPHAGE ) 500 mg tablet Take 500 mg by mouth in the morning and 500 mg in the evening. Take with meals.  Self  Active         Med Note LYNWOOD GLENNIS CHRISTELLA   Wed Aug 30, 2024  4:00 PM) Lf 05/19/24 #90ds  potassium chloride  (KLOR-CON ) 20 mEq ER tablet Take 20 mEq by mouth Once Daily for 5 days.  Patient not taking: Reported on 08/30/2024   Not Taking Self No Flag for Review (Therapy completed)  Med List Note Trenda CHRISTELLA Mayotte, CPhT 08/30/24 1601): DPS enrolled, also using [facility] [address]            Audit from Redirected Encounters   **Prior to Admission medications have not yet been reviewed for this encounter**     Selected Pharmacy:  [facility]  Comments: Medications verified by patient and [name]. Patient states  he does not take Humalog , states he was prescribed this medication, never picked it up and his doctor decided to start him on Lantus . Patient states he stopped taking atorvastatin  40 mg about 2 months ago due to a recall on the medication. Patient states he was taken off lisinopril  20 by his doctor. Patient taking metformin  500 mg twice daily. Patient states no longer taking potassium and magnesium, both are older medications that he was taking but is no longer taking. DPS enrolled, also using [facility] [address]. Electronically signed by: Glennis CHRISTELLA Mayotte, CPhT 08/30/2024 4:04 PM  Medications reconciled by provider: No   Signature/Co-signature, if required: Jemiya M Hardy, CPhT   Date/Time: 08/30/2024 4:04 PM  "

## 2024-08-31 ENCOUNTER — Other Ambulatory Visit: Payer: Self-pay

## 2024-08-31 NOTE — Progress Notes (Signed)
 Case Management Screening  CSN: [id number] DOB: 1975/11/04 Service: General Medicine Location: 712/01   Initial Screening Readmission Risk Score v2: 13.2 Risk Level: Low - Patient does not meet high risk criteria for post hospital services. Social Investment Banker, Operational will be available to assist as indicated.    Dorothe VEAR Hover, RN

## 2024-08-31 NOTE — Progress Notes (Addendum)
 "  HOSPITAL MEDICINE -  PROGRESS NOTE  Hospital Day: #1   Admission Date:  08/29/2024   PCP:  PCP Needed PPI   Extended Emergency Contact Information Primary Emergency Contact: Yang,Pansy Home Phone: [phone] Mobile Phone: [phone] Relation: Spouse   Discharge Readiness:  Timeframe: 1 to 2 days  Subjective  Patient states he vomited 1 time last night, no further vomiting, abdominal pain mostly resolved, complains of some bloating in the lower part as he has not had a bowel movement for few days.  He has history of pancreatitis, diagnosed 20 years ago, was thought was due to alcohol, patient does not drink alcohol heavily as per report. He feels constipated, wants to advance diet. Assessment/Plan   Assessment & Plan Abdominal pain Nausea and vomiting UA negative for UTI, CT abdomen pelvis with perinephric stranding in the left kidney, no signs and symptoms of UTI CT with normal pancreas, do not think this is acute pancreatitis Mild elevation of lipase likely secondary to nausea vomiting nonspecific Constipation likely contributing to his follow-up bloating, had BM x 1 Will give lactulose 30 g x 2 Start omeprazole Advance diet to regular diet, monitor Stage 3 chronic kidney disease (CMD) Renal function appears to be at baseline Baseline cr 1.5  Avoid nephrotoxins Type 2 diabetes mellitus with hyperglycemia, with long-term current use of insulin     (CMD) Last a1c results: 16.8 on 09/07/2018.  Hemoglobin A1c 1/14 is 9.7, overall improving. Reportedly takes lantus  30 units nightly, blood glucose trending up, will start 20 units nightly as he is starting to eat, continue SSI Consistent carb diet when appropriate  Hypoglycemia protocol HTN (hypertension) Blood pressure uncontrolled, patient was not taking hydrochlorothiazide at home.  Will restart home hydrochlorothiazide as patient is euvolemic Continue home lisinopril  and Norvasc  Monitor HLD  (hyperlipidemia) Reportedly not taking statin due to recall. Asthma (CMD) No exacerbation   Leukocytosis  Could be hemoconcentration  Afebrile  Unknown baseline  Out of bed to chair and activity increased  DVT Prophylaxis: Lovenox  SubQ  Active Medications: amLODIPine , 10 mg, oral, Daily BisaCODYL, 10 mg, rectal, Daily [Held by provider] hydroCHLOROthiazide, 25 mg, oral, Daily insulin  aspart (NovoLOG )/insulin  lispro (HumaLOG ) injection (WF), 0-12 Units, subcutaneous, Before meals & nightly lactulose, 30 g, oral, BID lisinopriL , 20 mg, oral, Once [START ON 09/01/2024] lisinopriL , 40 mg, oral, Daily polyethylene glycol, 17 g, oral, BID senna, 1 tablet, oral, BID   Infusions:   Objective   Temp:  [98.1 F (36.7 C)-99 F (37.2 C)] 98.2 F (36.8 C) Heart Rate:  [90-103] 102 Resp:  [16-18] 18 BP: (160-197)/(88-106) 160/88    Physical Exam Gen- awake, alert, not in acute distress  Head-  atraumatic, normocephalic  ENT- normal oral  mucosa Respiratory- B/l CTA, no wheezing, no crackles  CVS- s1s2+ regular, no murmurs  GI- soft, BS+, mild tenderness around periumbilical area , no guarding  Neuro- awake, alert, no FND Skin- normal turgor Psych- normal mood  Ext- no pedal edema    Labs   Results from last 2 days  Lab Units 08/31/24 0343 08/30/24 0601  WHITE BLOOD CELL COUNT 10*3/uL 12.47* 12.52*  HEMOGLOBIN g/dL 85.0 85.5  HEMATOCRIT % 46.7 44.9  PLATELET COUNT 10*3/uL 222 226    Results from last 2 days  Lab Units 08/31/24 0343 08/30/24 0601  SODIUM mmol/L 134* 137  POTASSIUM mmol/L 3.7 4.1  CHLORIDE mmol/L 102 104  CO2 mmol/L 18* 25  BUN mg/dL 17 19  CREATININE mg/dL 8.40* 8.36*  GLUCOSE mg/dL 810*  182*  CALCIUM  mg/dL 8.8 9.0    Results from last 2 days  Lab Units 08/31/24 0343 08/30/24 0601  MAGNESIUM mg/dL 1.8* 2.1      Wilnette Overman, MD    "

## 2024-09-04 ENCOUNTER — Other Ambulatory Visit: Payer: Self-pay

## 2024-09-18 ENCOUNTER — Other Ambulatory Visit: Payer: Self-pay

## 2024-09-18 DIAGNOSIS — E119 Type 2 diabetes mellitus without complications: Secondary | ICD-10-CM | POA: Diagnosis not present

## 2024-09-18 DIAGNOSIS — I10 Essential (primary) hypertension: Secondary | ICD-10-CM | POA: Diagnosis not present

## 2024-09-18 DIAGNOSIS — Z7984 Long term (current) use of oral hypoglycemic drugs: Secondary | ICD-10-CM | POA: Diagnosis not present

## 2024-09-18 DIAGNOSIS — E785 Hyperlipidemia, unspecified: Secondary | ICD-10-CM | POA: Diagnosis not present

## 2024-09-18 DIAGNOSIS — Z794 Long term (current) use of insulin: Secondary | ICD-10-CM | POA: Diagnosis not present

## 2024-09-18 MED ORDER — LOSARTAN POTASSIUM 25 MG PO TABS
25.0000 mg | ORAL_TABLET | Freq: Every day | ORAL | 2 refills | Status: AC
  Filled 2024-09-18: qty 30, 30d supply, fill #0

## 2024-09-18 MED ORDER — BLOOD PRESSURE MONITOR DEVI: 0 refills | Status: AC

## 2024-09-18 NOTE — Progress Notes (Signed)
 "  09/18/2024 Name: Wayne Leonard MRN: [id number] DOB: [birth year]  Chief Complaint  Patient presents with   Diabetes   Hypertension    Wayne Leonard is a 49 y.o. year old male who presented for a telephone visit.   They were referred to the pharmacist by their PCP for assistance in managing diabetes. PMH includes HTN, OSA, T2DM, HLD, CKD3a, tobacco use, BMI > 30.    Subjective: Patient was last seen by PCP, Wayne Sandhoff, MD, on 03/24/24. At last visit, BP was elevated to 163/88 mmHg, HR 75 bpm. A1C had improved from 12.2% to 11.7%. Patient was prescribed Lantus  30 units BID but reported only taking 30 units once daily due to his work schedule (5PM-2AM) and was intermittently noncompliant with metformin . Reported polyuria and polydipsia. TIR was only 16% with average BG of 270 mg/dL. He was instructed to start Ozempic  0.25 mg weekly, take Lantus  36 units once daily, continue metformin  1000 mg BID, and continue Jardiance  10 mg daily. He was also instructed to start atorvastatin  40 mg daily. However, his BMP returned with evidence of an AKI, (Scr 1.6 > 2.03 mg/dL) and he was instructed to hold amlodipine , losartan , Jardiance , and metformin . Repeat BMP demonstrated Scr returned to 1.6 mg/dL. He was engaged by pharmacy via telephone on 04/27/24. Discovered that he had only been dialing Ozempic  up by 1 click, rather than to 0.25 mg, so he was given instructions to restart at 0.25 mg weekly. He was taking metformin  at a reduced dose of 500 mg BID and he had recently run out of Jardiance  and atorvastatin . At follow-up several weeks later, TIR was 92% and GMI was 6.3%. Patient was subsequently lost to follow-up with pharmacy and re-engaged in person on 08/21/24. Reported being out of Ozempic  for > 1 mo and taking oral medications and insulin  very inconsistently due to work schedule. BP was 158/101 mmHg (had not taken medications). All medications were ready for patient to pick up after appt at [facility] pharmacy, but  he did not pick them up. Attempted to complete adherence call a week later and discovered that patient was hospitalized at [facility]) for nausea/vomiting, hypertensive urgency. Initially suspected pancreatitis, but no evidence of pancreatitis on CT. 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 Currently only drinks alcohol once or twice per year. New medications per discharge summary include Humalog  4 units TID before meals and labetalol 200 mg TID.  Today, patient reports doing ok. No further episodes of nausea/vomiting. States his only new medication is labetalol - which he is taking 200 mg once daily. Reports that he was not started on meal time insulin  at hospital discharge and has continued Lantus  30 units daily, though he does not have a way to monitor his blood sugars currently. He is using a family member's BP cuff occasionally and most recent reading was ~172/98 mmHg. He would like to continue using [facility] pharmacy to fill his medications, rather than switch to [employer] or mail order.  Care Team: Primary Care Provider: Sandhoff Missy, MD ; Next Scheduled Visit: Needs to be scheduled    Medication Access/Adherence  Current Pharmacy:  [place] [facility] [address] [place] [address] Phone: [phone] Fax: [phone]  [id number] - [place], [place] - [address] AT [facility] [address] [place] [address] Phone: [phone] Fax: [phone]  [id number] - [place] [place] -  [address] [place] [address] Phone: [phone] Fax: [phone]  [facility] [address] [place] [address] Phone: [phone] Fax: [phone]  [facility] - [place], [place] - [address] [place] [address] Phone: [phone] Fax: [phone]   Patient reports affordability concerns with their medications: No   Patient reports access/transportation concerns to their pharmacy: Yes  - He lives in [place], and sometimes has difficulty getting to the [facility] pharmacy in [place] for his medications. Does not want to switch pharmacies, but I previously provided him with information for the [facility] pharmacy in [facility]. Also discussed possibility of delivery today.  Patient reports adherence concerns with their medications:  Yes  - history of taking medications incorrectly.   Type 2 Diabetes:  Current medications: Ozempic  0.25 mg (has not taken since October), Lantus  26 units once daily (taking 30 units daily), metformin  IR 1000 mg BID (taking 1000 mg daily), Jardiance  10 mg daily (states he is taking)  Current glucose readings: previously using Dexcom G7 but has not had recently.  Date of Download: No data since 08/21/24 (included in last note)  Patient denies hypoglycemic s/sx including dizziness, shakiness, sweating. Patient denies hyperglycemic symptoms including polyuria, polydipsia, polyphagia, nocturia, neuropathy, blurred vision.  Current meal patterns: Did not discuss in-depth today.   Current physical activity: did not discuss today  Current medication access support: Medicaid - reports copays are affordable  Macrovascular and Microvascular Risk Reduction:  Statin? Atorvastatin  40 mg daily - previously instructed to restart; ACEi/ARB? None currently, previously prescribed losartan  but he does not recall ever taking this. Today, states he would be willing to restart since BP has been elevated.  Last urinary albumin/creatinine ratio:  Lab Results  Component Value Date   MICRALBCREAT 26 12/06/2023   MICRALBCREAT 42 (H) 01/29/2020   MICRALBCREAT <2.1 09/24/2015   Last eye exam:  Lab Results  Component Value Date   HMDIABEYEEXA No Retinopathy  02/03/2016   Last foot exam: 03/24/2024 Tobacco Use:  Tobacco Use: High Risk (03/27/2024)   Patient History    Smoking Tobacco Use: Every Day    Smokeless Tobacco Use: Never    Passive Exposure: Not on file    Objective:  BP Readings from Last 3 Encounters:  08/21/24 (!) 158/101  03/24/24 (!) 163/88  12/06/23 (!) 151/94    Lab Results  Component Value Date   HGBA1C 8.6 (A) 08/21/2024   HGBA1C 11.7 (A) 03/24/2024   HGBA1C 12.2 (A) 10/28/2023       Latest Ref Rng & Units 03/31/2024   10:37 AM 03/24/2024   10:30 AM 12/06/2023   10:20 AM  BMP  Glucose 70 - 99 mg/dL 786  808  839   BUN 6 - 24 mg/dL 15  16  13    Creatinine 0.76 - 1.27 mg/dL 8.33  7.96  8.34   BUN/Creat Ratio 9 - 20   8   Sodium 134 - 144 mmol/L 137  140  140   Potassium 3.5 - 5.2 mmol/L 4.0  4.0  4.4   Chloride 96 - 106 mmol/L 105  104  105   CO2 20 - 29 mmol/L 17  20  23    Calcium  8.7 - 10.2 mg/dL 8.9  9.3  9.3     Lab Results  Component Value Date   CHOL 218 (H) 03/24/2024  HDL 33 (L) 03/24/2024   LDLCALC 129 (H) 03/24/2024   TRIG 314 (H) 03/24/2024   CHOLHDL 6.6 (H) 03/24/2024    Medications Reviewed Today     Reviewed by Brinda Lorain SQUIBB, RPH-CPP (Pharmacist) on 09/18/24 at 1711  Med List Status: <None>   Medication Order Taking? Sig Documenting Provider Last Dose Status Informant  amLODipine  (NORVASC ) 10 MG tablet [id number] Yes Take 1 tablet (10 mg total) by mouth daily. Koomson, Julius, MD  Active   atorvastatin  (LIPITOR) 40 MG tablet [id number] Yes Take 1 tablet (40 mg total) by mouth daily. Koomson, Julius, MD  Active   Continuous Glucose Sensor (DEXCOM G7 SENSOR) [place] [id number]  Replace sensor every 10 days.  Patient not taking: Reported on 09/18/2024   Koomson, Julius, MD  Active   empagliflozin  (JARDIANCE ) 10 MG TABS tablet [id number] Yes Take 1 tablet (10 mg total) by mouth daily before breakfast. Celestina Czar, MD  Active   glucose blood (TRUE METRIX BLOOD GLUCOSE TEST) test strip [id number]   Check blood sugars 3 (three) times daily. Stephanie Freund, MD  Active   insulin  glargine (LANTUS ) 100 UNIT/ML Solostar Pen [id number] Yes Inject 36 Units into the skin daily. Celestina Czar, MD  Active   Insulin  Pen Needle 32G X 4 MM MISC [id number]  Use as directed. Lovie Clarity, MD  Active   labetalol (NORMODYNE) 200 MG tablet [id number] Yes Take 200 mg by mouth 3 (three) times daily. [provider]  Active   metFORMIN  (GLUCOPHAGE -XR) 500 MG 24 hr tablet [id number] Yes Take 1 tablet (500 mg total) by mouth 2 (two) times daily with a meal. Celestina Czar, MD  Active    Patient not taking:   Discontinued 09/18/24 1511 (Change in therapy)   sildenafil  (VIAGRA ) 50 MG tablet [id number]  Take 1 tablet (50 mg total) by mouth as needed for erectile dysfunction. Addie Perkins, DO  Active               Assessment/Plan:   Type 2 Diabetes: - Currently uncontrolled with most recent A1C of 8.6% above goal <7%. Patient with recent hospitalization for nausea/vomiting (without evidence of acute pancreatitis on CT). 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 Given this history, do not feel that patient is a good candidate for GLP-1RA at this time. Primary focus today is resuming monitoring with Dexcom G7 to guide insulin  dose adjustment. Will continue TDD of metformin  1000 mg/d due to fluctuating renal function.  - Last UACR April 2025 - 21 mg/g - Reviewed long term cardiovascular and renal outcomes of uncontrolled blood sugar - Reviewed goal A1c, goal fasting, and goal 2 hour post prandial glucose - Reviewed hypoglycemia management plan and the rule of 15 - Reviewed dietary modifications including  utilizing the healthy plate method, limiting portion size of carbohydrate foods, increasing intake of protein and non-starchy vegetables. Counseled patient to stay hydrated  with water throughout the day. - Removed Ozempic  from medication list given personal hx of recurrent pancreatitis. - Recommend to continue Lantus  30 units daily. Advised to take at the same time every day. - Recommend to continue Jardiance  10 mg daily - Recommend to continue metformin  XR 500 mg BID - Recommend to check glucose continuously with Dexcom G7 - will request that it is shipped out from [facility]. - Next A1C due 11/19/24     Hypertension: - Currently uncontrolled  with clinic BP consistently above goal less than 130/80, despite restarting amlodipine . Patient is not currently taking labetalol as prescribed due to difficulty adhering to TID dosing. Advised to take TID given elevated pressures, but would like to replace with more effective medication at follow-up. Recommend restarting ARB given multiple indications with DM + CKD. Will need close lab monitoring with dx of CKD3a, last eGFR ~41 mL/min.   - Reviewed long term cardiovascular and renal outcomes of uncontrolled blood pressure - Will place orders for home BP to be delivered from [facility] - Recommend to continue amlodipine  10 mg daily, labetalol 200 mg TID - Recommend to START losartan  25 mg daily. Patient to return to PCP office in ~2 weeks for HFU and lab monitoring.    Hyperlipidemia/ASCVD Risk Reduction: - Currently uncontrolled with most recent LDL-C of 129 mg/dL above goal < 70 mg/dL given DM + comorbidities, and TG > 300 mg/dL. Expect TG to improve with glycemic control. High intensity statin is indicated.  - Reviewed long term complications of uncontrolled cholesterol - Recommend to continue atorvastatin  40 mg daily - Repeat lipid panel 6-12 weeks after consistent dosing of statin. - Revisit smoking cessation at follow-up.   Patient verbalized understanding of treatment plan.   Follow Up Plan:  Pharmacist telephone 10/09/24 PCP 09/29/24   Lorain Baseman, PharmD [facility] [phone]   "

## 2024-09-19 ENCOUNTER — Other Ambulatory Visit: Payer: Self-pay

## 2024-09-19 ENCOUNTER — Other Ambulatory Visit (HOSPITAL_COMMUNITY): Payer: Self-pay

## 2024-09-29 ENCOUNTER — Ambulatory Visit: Payer: Self-pay

## 2024-10-09 ENCOUNTER — Other Ambulatory Visit: Payer: Self-pay
